# Patient Record
Sex: Female | Born: 1990
Health system: Southern US, Community
[De-identification: ages and names within clinical notes are randomized; demographics above are authoritative.]

## PROBLEM LIST (undated history)

## (undated) ENCOUNTER — Inpatient Hospital Stay (HOSPITAL_COMMUNITY): Payer: Self-pay

## (undated) DIAGNOSIS — N96 Recurrent pregnancy loss: Secondary | ICD-10-CM

## (undated) DIAGNOSIS — Z3490 Encounter for supervision of normal pregnancy, unspecified, unspecified trimester: Secondary | ICD-10-CM

## (undated) DIAGNOSIS — L309 Dermatitis, unspecified: Secondary | ICD-10-CM

## (undated) DIAGNOSIS — J3089 Other allergic rhinitis: Secondary | ICD-10-CM

## (undated) DIAGNOSIS — Z91018 Allergy to other foods: Secondary | ICD-10-CM

## (undated) HISTORY — PX: TYMPANOSTOMY TUBE PLACEMENT: SHX32

## (undated) HISTORY — DX: Dermatitis, unspecified: L30.9

---
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
## 2010-08-19 ENCOUNTER — Emergency Department (HOSPITAL_COMMUNITY): Admission: EM | Admit: 2010-08-19 | Discharge: 2010-08-19 | Payer: Self-pay | Admitting: Emergency Medicine

## 2010-12-06 LAB — DIFFERENTIAL
Basophils Relative: 0 % (ref 0–1)
Eosinophils Absolute: 0.2 10*3/uL (ref 0.0–0.7)
Eosinophils Relative: 2 % (ref 0–5)
Lymphs Abs: 2.7 10*3/uL (ref 0.7–4.0)
Monocytes Relative: 5 % (ref 3–12)
Neutrophils Relative %: 69 % (ref 43–77)

## 2010-12-06 LAB — BASIC METABOLIC PANEL
CO2: 26 mEq/L (ref 19–32)
Calcium: 9.9 mg/dL (ref 8.4–10.5)
Chloride: 106 mEq/L (ref 96–112)
Creatinine, Ser: 0.68 mg/dL (ref 0.4–1.2)
Glucose, Bld: 95 mg/dL (ref 70–99)

## 2010-12-06 LAB — CBC
MCH: 32.4 pg (ref 26.0–34.0)
MCHC: 36.4 g/dL — ABNORMAL HIGH (ref 30.0–36.0)
MCV: 89 fL (ref 78.0–100.0)
Platelets: 328 10*3/uL (ref 150–400)

## 2010-12-06 LAB — PROTIME-INR
INR: 1.01 (ref 0.00–1.49)
Prothrombin Time: 13.5 seconds (ref 11.6–15.2)

## 2010-12-20 ENCOUNTER — Emergency Department (HOSPITAL_COMMUNITY): Payer: Self-pay

## 2010-12-20 ENCOUNTER — Emergency Department (HOSPITAL_COMMUNITY)
Admission: EM | Admit: 2010-12-20 | Discharge: 2010-12-20 | Disposition: A | Discharge status: Home / Self Care | Payer: Self-pay | Attending: Emergency Medicine | Admitting: Emergency Medicine

## 2010-12-20 DIAGNOSIS — R109 Unspecified abdominal pain: Secondary | ICD-10-CM | POA: Insufficient documentation

## 2010-12-20 LAB — CBC
HCT: 41.2 % (ref 36.0–46.0)
MCH: 30.1 pg (ref 26.0–34.0)
MCHC: 34.2 g/dL (ref 30.0–36.0)
MCV: 88 fL (ref 78.0–100.0)
Platelets: 313 10*3/uL (ref 150–400)
RDW: 12.1 % (ref 11.5–15.5)

## 2010-12-20 LAB — BASIC METABOLIC PANEL
BUN: 9 mg/dL (ref 6–23)
CO2: 22 mEq/L (ref 19–32)
GFR calc non Af Amer: >60 mL/min (ref >60)
Glucose, Bld: 106 mg/dL — ABNORMAL HIGH (ref 70–99)
Potassium: 3.8 mEq/L (ref 3.5–5.1)
Sodium: 136 mEq/L (ref 135–145)

## 2010-12-20 LAB — DIFFERENTIAL
Eosinophils Absolute: 0.3 10*3/uL (ref 0.0–0.7)
Eosinophils Relative: 3 % (ref 0–5)
Lymphocytes Relative: 29 % (ref 12–46)
Lymphs Abs: 2.5 10*3/uL (ref 0.7–4.0)
Monocytes Absolute: 0.7 10*3/uL (ref 0.1–1.0)

## 2010-12-20 LAB — URINALYSIS, ROUTINE W REFLEX MICROSCOPIC
Bilirubin Urine: NEGATIVE
Hgb urine dipstick: NEGATIVE
Ketones, ur: NEGATIVE mg/dL
Nitrite: NEGATIVE
Protein, ur: NEGATIVE mg/dL
Specific Gravity, Urine: 1.03 (ref 1.005–1.030)
Urobilinogen, UA: 0.2 mg/dL (ref 0.0–1.0)

## 2011-01-04 LAB — URINALYSIS, ROUTINE W REFLEX MICROSCOPIC
Bilirubin Urine: NEGATIVE
Glucose, UA: NEGATIVE mg/dL
Nitrite: NEGATIVE
Specific Gravity, Urine: 1.02 (ref 1.005–1.030)
pH: 6.5 (ref 5.0–8.0)

## 2011-01-04 LAB — DIFFERENTIAL
Eosinophils Relative: 2 % (ref 0–5)
Lymphocytes Relative: 26 % (ref 24–48)
Lymphs Abs: 2.5 10*3/uL (ref 1.1–4.8)
Monocytes Absolute: 0.6 10*3/uL (ref 0.2–1.2)

## 2011-01-04 LAB — PREGNANCY, URINE: Preg Test, Ur: NEGATIVE

## 2011-01-04 LAB — BASIC METABOLIC PANEL
Chloride: 104 mEq/L (ref 96–112)
Glucose, Bld: 97 mg/dL (ref 70–99)
Potassium: 3.9 mEq/L (ref 3.5–5.1)
Sodium: 139 mEq/L (ref 135–145)

## 2011-01-04 LAB — CBC
HCT: 44.2 % (ref 36.0–49.0)
Hemoglobin: 15.4 g/dL (ref 12.0–16.0)
MCV: 90.9 fL (ref 78.0–98.0)
RDW: 12 % (ref 11.4–15.5)
WBC: 9.7 10*3/uL (ref 4.5–13.5)

## 2011-08-15 IMAGING — CT CT ABD-PELV W/O CM
3 of 4 series · 9 of 46 positions shown, 16 images · non-contrast
Comparison: CT abdomen and pelvis 01/12/2009.

CLINICAL DATA: Right lower quadrant abdominal pain.

CT ABDOMEN AND PELVIS WITHOUT CONTRAST 12/20/2010:
TECHNIQUE: Multidetector CT imaging of the abdomen and pelvis was
performed following the standard protocol without intravenous
contrast.

[Series 3: lung 5.0 b60f · axial · 0.72mm/px · z∈[-140,-55]mm · 5 of 27 slices shown, 10 images]
[im 5/27  soft-tissue]
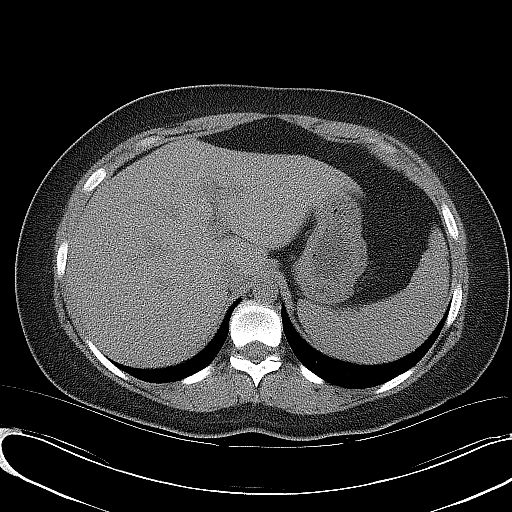
[im 5/27  bone]
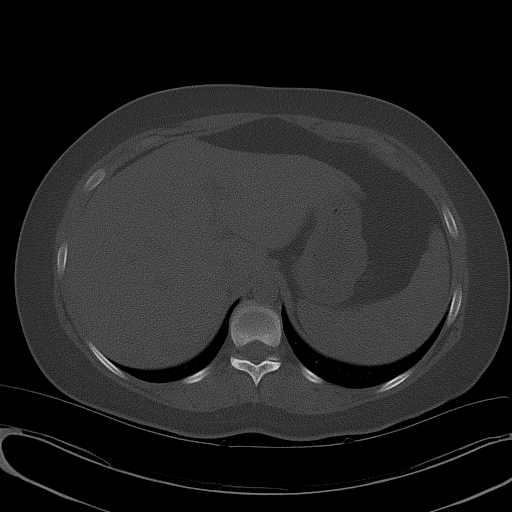
[im 9/27  soft-tissue]
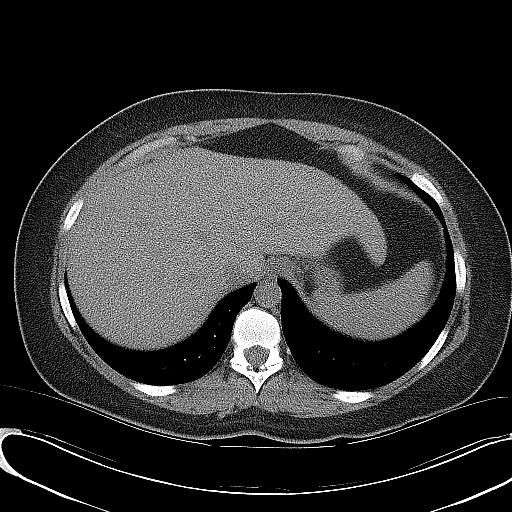
[im 9/27  lung]
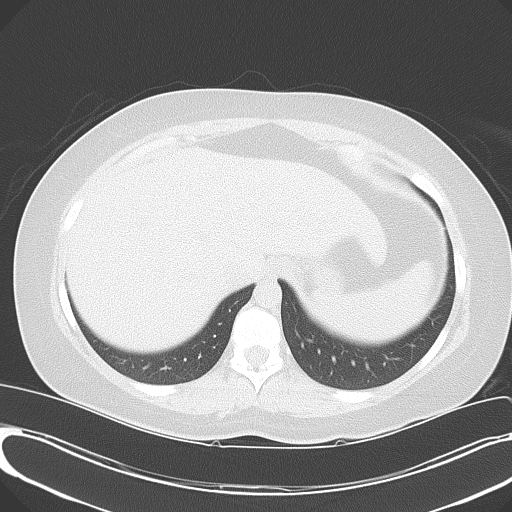
[im 14/27  soft-tissue]
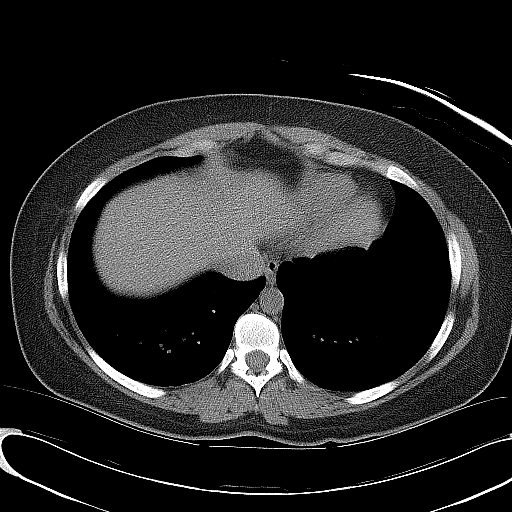
[im 14/27  lung]
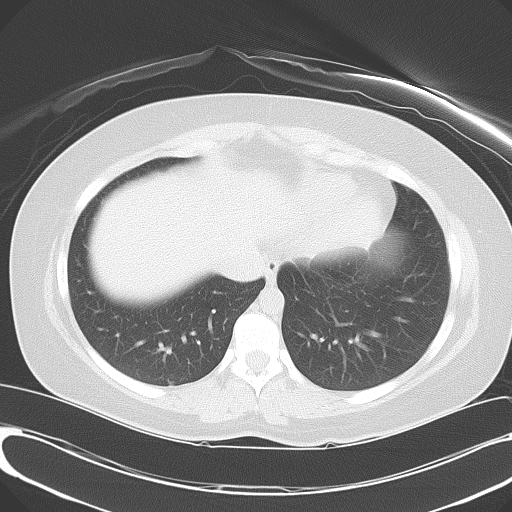
[im 18/27  soft-tissue]
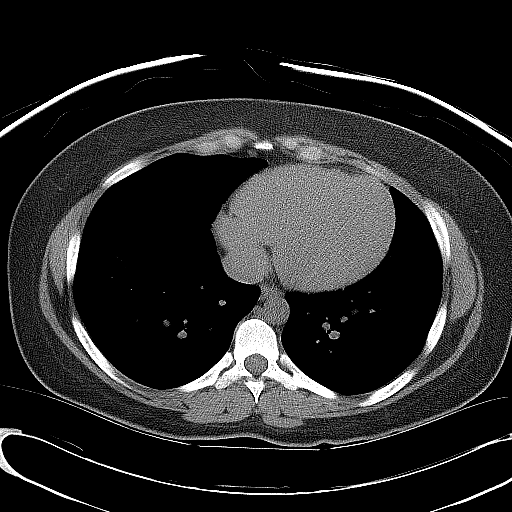
[im 18/27  lung]
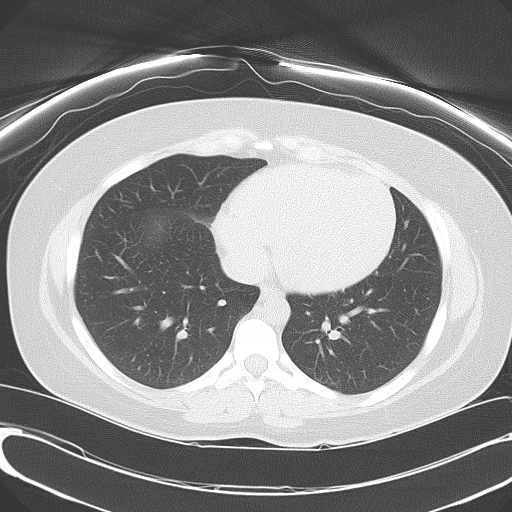
[im 22/27  soft-tissue]
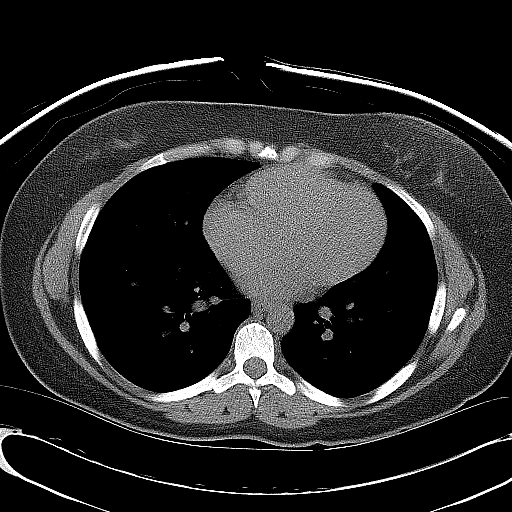
[im 22/27  lung]
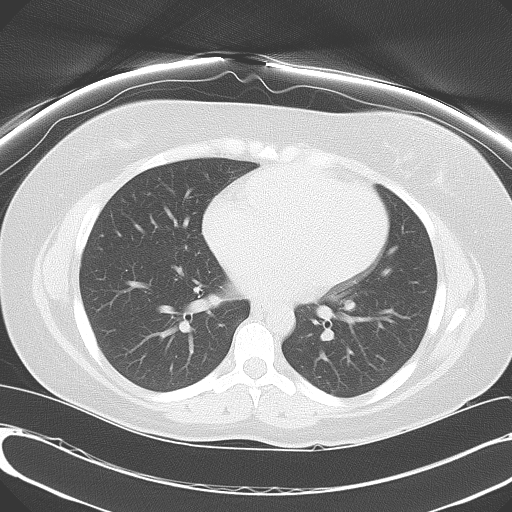

[Series 4: mpr coronal (id) · coronal · 0.69mm/px · 3 of 91 slices shown, 4 images]
[im 31/91  soft-tissue]
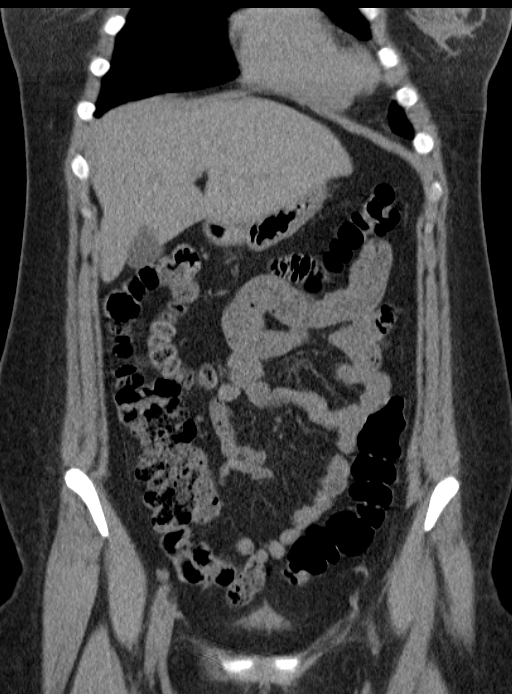
[im 41/91  soft-tissue]
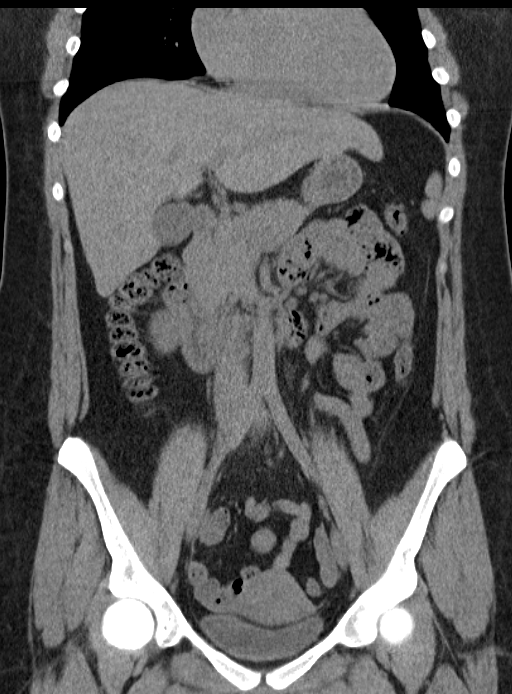
[im 41/91  bone]
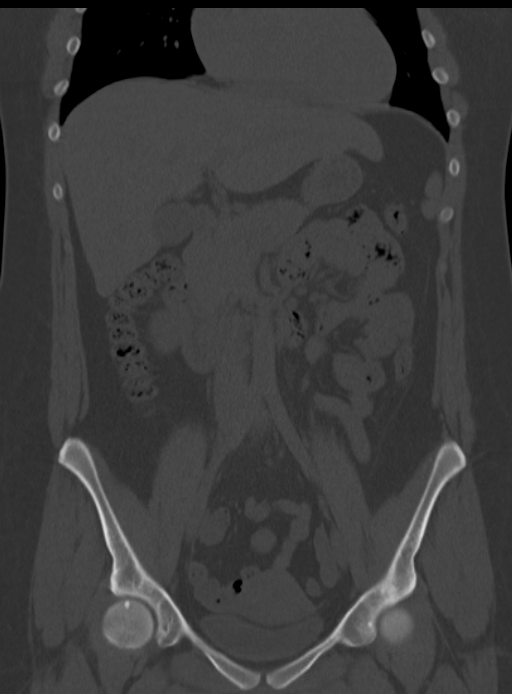
[im 51/91  soft-tissue]
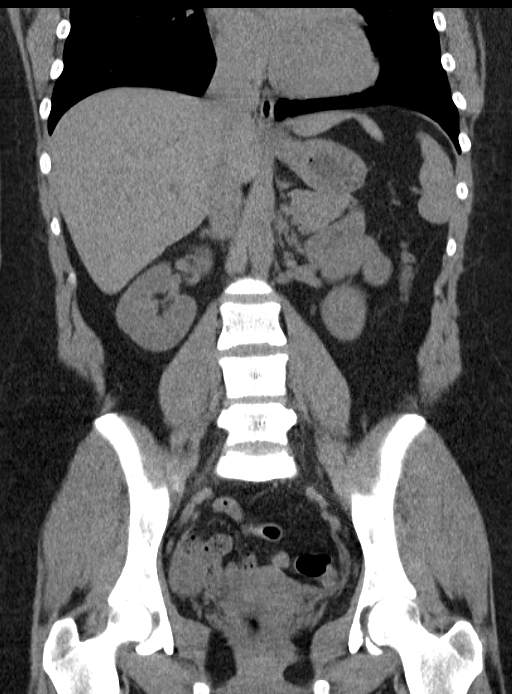

[Series 5: mpr sagittal (id) · sagittal · 0.63mm/px · 1 of 121 slices shown, 2 images]
[im 41/121  soft-tissue]
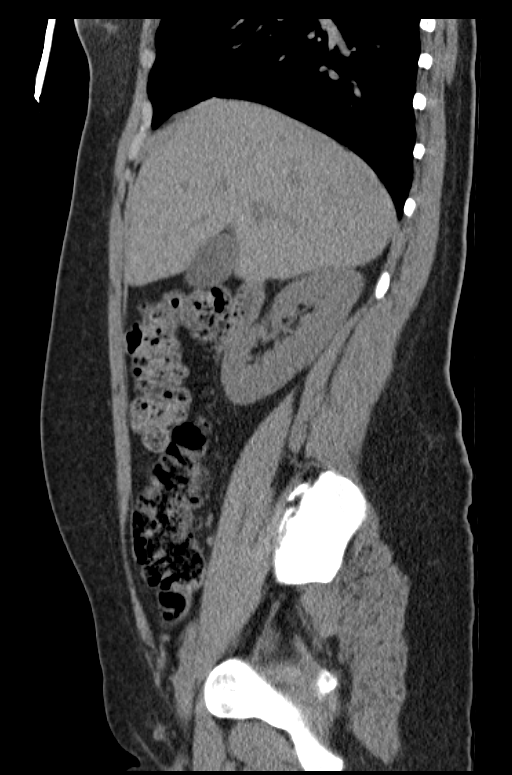
[im 41/121  bone]
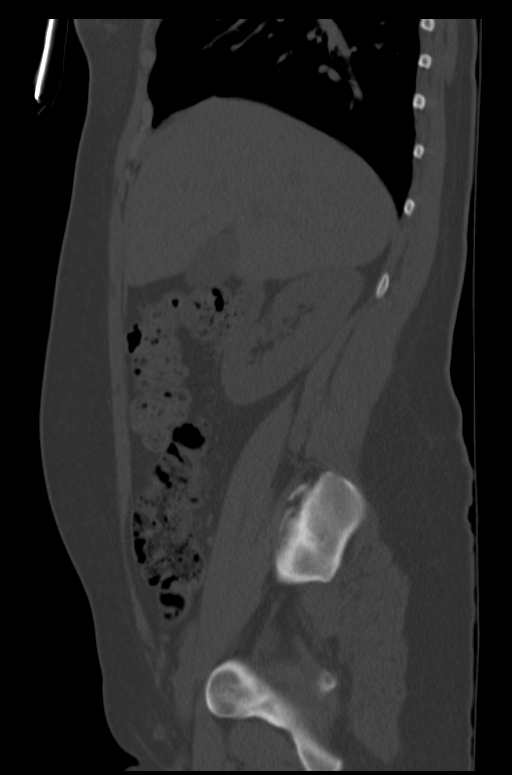

[9 of 46 positions shown; findings below may reference images not displayed]

FINDINGS: Normal appendix in the right upper pelvis.  Stomach
decompressed and unremarkable by CT.  Small bowel and colon normal
in appearance.  No ascites.

Normal low dose unenhanced appearance of the liver, spleen,
pancreas, adrenal glands, and kidneys.  Gallbladder unremarkable by
CT.  No biliary ductal dilation.  Scattered normal-sized lymph
nodes in the mesentery and retroperitoneum; no significant
lymphadenopathy.  No visible aorto-iliofemoral atherosclerosis.

Uterus normal by CT.  Normal appearing ovaries.  No free pelvic
fluid.  Visualized lung bases clear.  Heart size normal.  Bone
window images unremarkable; bone island noted in the L3 vertebral
body.
IMPRESSION: Normal unenhanced CT of the abdomen and pelvis.

## 2012-12-24 HISTORY — PX: LAPAROSCOPIC CHOLECYSTECTOMY: SUR755

## 2013-05-09 ENCOUNTER — Emergency Department (HOSPITAL_COMMUNITY)
Admission: EM | Admit: 2013-05-09 | Discharge: 2013-05-09 | Disposition: A | Discharge status: Home / Self Care | Payer: Worker's Compensation | Attending: Emergency Medicine | Admitting: Emergency Medicine

## 2013-05-09 ENCOUNTER — Encounter (HOSPITAL_COMMUNITY): Payer: Self-pay

## 2013-05-09 DIAGNOSIS — Y9389 Activity, other specified: Secondary | ICD-10-CM | POA: Insufficient documentation

## 2013-05-09 DIAGNOSIS — W460XXA Contact with hypodermic needle, initial encounter: Secondary | ICD-10-CM | POA: Insufficient documentation

## 2013-05-09 DIAGNOSIS — X58XXXA Exposure to other specified factors, initial encounter: Secondary | ICD-10-CM

## 2013-05-09 DIAGNOSIS — S61209A Unspecified open wound of unspecified finger without damage to nail, initial encounter: Secondary | ICD-10-CM | POA: Insufficient documentation

## 2013-05-09 DIAGNOSIS — Y9210 Unspecified residential institution as the place of occurrence of the external cause: Secondary | ICD-10-CM | POA: Insufficient documentation

## 2013-05-09 NOTE — ED Provider Notes (Signed)
CSN: [id number]     Arrival date & time 05/09/13  1419 History     First MD Initiated Contact with Patient 05/09/13 1506     Chief Complaint  Patient presents with  . Body Fluid Exposure   (Consider location/radiation/quality/duration/timing/severity/associated sxs/prior Treatment) HPI Comments: LAKETIA VICKNAIR is a 22 y.o. female who presents to the Emergency Department complaining of recent blood exposure secondary to a needle stick that occurred at her place of employment.  Incident occurred several hours PTA. Reports needle stick to the right index finger. Patient states the incident was reported to her supervisor and the wound site was washed immediately after exposure.  patient is employee of [employer].  She states the source individual consented to blood draw and his results are pending.  Patient denies pain or sx's at this time.    The history is provided by the patient.    History reviewed. No pertinent past medical history. Past Surgical History  Procedure Laterality Date  . Cholecystectomy     No family history on file. History  Substance Use Topics  . Smoking status: Never Smoker   . Smokeless tobacco: Not on file  . Alcohol Use: Yes   OB History   Grav Para Term Preterm Abortions TAB SAB Ect Mult Living                 Review of Systems  Constitutional: Negative for fever and chills.  Genitourinary: Negative for dysuria and difficulty urinating.  Musculoskeletal: Negative for joint swelling and arthralgias.  Skin: Positive for wound. Negative for color change.       Needle stick to right index finger  Neurological: Negative for dizziness, weakness and numbness.  Hematological: Negative for adenopathy. Does not bruise/bleed easily.  All other systems reviewed and are negative.    Allergies  Review of patient's allergies indicates no known allergies.  Home Medications  No current outpatient prescriptions on file. BP 119/63  Pulse 65   Temp(Src) 98 F (36.7 C) (Oral)  Resp 20  Ht 5\' 6"  (1.676 m)  Wt 180 lb (81.647 kg)  BMI 29.07 kg/m2  SpO2 98%  LMP 04/25/2013 Physical Exam  Nursing note and vitals reviewed. Constitutional: She is oriented to person, place, and time. She appears well-developed and well-nourished. No distress.  HENT:  Head: Normocephalic and atraumatic.  Cardiovascular: Normal rate, regular rhythm, normal heart sounds and intact distal pulses.   No murmur heard. Pulmonary/Chest: Effort normal and breath sounds normal.  Musculoskeletal: Normal range of motion. She exhibits no edema.  Pin point puncture wound to the distal right index finger.  No erythema or active bleeding.  Pt has full ROM of the finger.  Radial pulse brisk, distal sensation intact  Neurological: She is alert and oriented to person, place, and time. She exhibits normal muscle tone. Coordination normal.  Skin: Skin is warm and dry.    ED Course   Procedures (including critical care time)  Labs Reviewed  RAPID HIV SCREEN ([facility])  HEPATITIS B SURFACE ANTIGEN  HEPATITIS B SURFACE ANTIBODY  HEPATITIS C ANTIBODY   No results found. 1. Needle exposure, initial encounter     MDM   Consulted Stephanie with occupational health and Claris Che in the lab to confirm correct lab orders.   Pt requesting discharge, VSS,  Stable at this time.  I advised that I will contact her with her HIV results.    Rapid HIV is non-reactive, remaining labs are pending.  Patient informed of  rapid HIV results via telephone after discharge.  She agrees to close f/u with her PMD and to return here if needed  Deion Swift L. Trisha Mangle, PA-C 05/10/13 2128

## 2013-05-09 NOTE — ED Notes (Signed)
Pt reports being stuck by a needle at work today.  Pt is employed by [employer].

## 2013-05-09 NOTE — ED Notes (Signed)
Iva Lento, PA , spoke with Judeth Cornfield RN at [facility] to confirm correct lab work being done.

## 2013-05-09 NOTE — ED Notes (Signed)
Needle stick to rt index finger distal phalanx, tiny puncture. No bleeding  Pt washed area  pta.

## 2013-05-10 LAB — HEPATITIS C ANTIBODY: HCV Ab: NEGATIVE

## 2013-05-11 NOTE — ED Provider Notes (Signed)
Medical screening examination/treatment/procedure(s) were performed by non-physician practitioner and as supervising physician I was immediately available for consultation/collaboration.  Arnice Vanepps, MD 05/11/13 1517 

## 2015-05-19 ENCOUNTER — Encounter (HOSPITAL_COMMUNITY): Payer: Self-pay | Admitting: Emergency Medicine

## 2015-05-19 ENCOUNTER — Emergency Department (HOSPITAL_COMMUNITY)
Admission: EM | Admit: 2015-05-19 | Discharge: 2015-05-19 | Disposition: A | Discharge status: Home / Self Care | Payer: Self-pay | Attending: Emergency Medicine | Admitting: Emergency Medicine

## 2015-05-19 DIAGNOSIS — L509 Urticaria, unspecified: Secondary | ICD-10-CM

## 2015-05-19 DIAGNOSIS — L500 Allergic urticaria: Secondary | ICD-10-CM | POA: Insufficient documentation

## 2015-05-19 DIAGNOSIS — R062 Wheezing: Secondary | ICD-10-CM | POA: Insufficient documentation

## 2015-05-19 DIAGNOSIS — R0789 Other chest pain: Secondary | ICD-10-CM | POA: Insufficient documentation

## 2015-05-19 DIAGNOSIS — F419 Anxiety disorder, unspecified: Secondary | ICD-10-CM | POA: Insufficient documentation

## 2015-05-19 MED ORDER — PREDNISONE 50 MG PO TABS
60.0000 mg | ORAL_TABLET | Freq: Once | ORAL | Status: AC
  Administered 2015-05-19: 60 mg via ORAL
  Filled 2015-05-19 (×2): qty 1

## 2015-05-19 MED ORDER — PREDNISONE 10 MG PO TABS: ORAL_TABLET | ORAL | Status: DC

## 2015-05-19 MED ORDER — HYDROXYZINE HCL 50 MG/ML IM SOLN
50.0000 mg | Freq: Once | INTRAMUSCULAR | Status: AC
  Administered 2015-05-19: 50 mg via INTRAMUSCULAR
  Filled 2015-05-19: qty 1

## 2015-05-19 MED ORDER — HYDROXYZINE HCL 25 MG PO TABS: 25.0000 mg | ORAL_TABLET | Freq: Four times a day (QID) | ORAL | Status: DC

## 2015-05-19 NOTE — ED Notes (Signed)
Pt states she woke up itching with rash. Pt also c/o wheezing.

## 2015-05-19 NOTE — ED Notes (Signed)
   05/19/15 0050  Skin Color/Condition  Skin Color/Condition (WDL) X  Skin Color Appropriate for ethnicity  Skin Integrity Intact  Skin Condition Dry  pt presents w/ rash & itching all over. Pt denies any new foods, meds, clothes or detergents.

## 2015-05-19 NOTE — ED Notes (Signed)
Pt alert & oriented x4, stable gait. Patient given discharge instructions, paperwork & prescription(s). Patient  instructed to stop at the registration desk to finish any additional paperwork. Patient verbalized understanding. Pt left department w/ no further questions. 

## 2015-05-19 NOTE — ED Notes (Signed)
Pt feeling better at present, itching has lessened & rash has lightened up also. Pt resting w/ family at bedside. NAD noted. No needs voiced.

## 2015-05-19 NOTE — ED Provider Notes (Signed)
CSN: [id number]     Arrival date & time 05/19/15  0026 History   First MD Initiated Contact with Patient 05/19/15 [phone]     Chief Complaint  Patient presents with  . Allergic Reaction     (Consider location/radiation/quality/duration/timing/severity/associated sxs/prior Treatment) HPI Comments: Patient is a 24 year old female who presents to the emergency department with "allergic reaction".  The patient states that she was awakened tonight by itching, hives, and wheezing. She has a sensation that she was having difficulty with her breathing. She was able to converse with her mother on the phone, and tell her that she was coming to her mother's home and wanted to be taken to the emergency department. The patient was able to drive herself to her mother's home before being brought to the emergency department. The patient states that she does not recall anything new to eat or drink today. She has not had any new clothing, detergent, or dryer sheets, or new environment. Patient states this is the fifth episode this year. She has been evaluated by her primary physician, and advised to see an allergist, but states she does not have insurance, and is reluctant to see an allergist on a cash basis. The patient states she took some Benadryl at home, and she feels slightly better, but still has some itching.  Patient is a 24 y.o. female presenting with allergic reaction. The history is provided by the patient.  Allergic Reaction Presenting symptoms: difficulty breathing, itching, rash and wheezing   Presenting symptoms: no difficulty swallowing     History reviewed. No pertinent past medical history. Past Surgical History  Procedure Laterality Date  . Cholecystectomy     History reviewed. No pertinent family history. Social History  Substance Use Topics  . Smoking status: Never Smoker   . Smokeless tobacco: None  . Alcohol Use: Yes   OB History    No data available     Review of Systems   HENT: Negative for trouble swallowing.   Respiratory: Positive for chest tightness and wheezing. Negative for cough.   Skin: Positive for itching and rash.  Psychiatric/Behavioral: The patient is nervous/anxious.   All other systems reviewed and are negative.     Allergies  Review of patient's allergies indicates no known allergies.  Home Medications   Prior to Admission medications   Not on File   BP 100/49 mmHg  Pulse 69  Temp(Src) 98.2 F (36.8 C)  Resp 22  Ht 5\' 6"  (1.676 m)  Wt 175 lb (79.379 kg)  BMI 28.26 kg/m2  SpO2 99%  LMP 05/17/2015 Physical Exam  Constitutional: She is oriented to person, place, and time. She appears well-developed and well-nourished.  Non-toxic appearance.  HENT:  Head: Normocephalic.  Right Ear: Tympanic membrane and external ear normal.  Left Ear: Tympanic membrane and external ear normal.  The airway is patent. The uvula is in the midline. Patient speaks in complete sentences.  Eyes: EOM and lids are normal. Pupils are equal, round, and reactive to light.  Neck: Normal range of motion. Neck supple. Carotid bruit is not present.  Cardiovascular: Normal rate, regular rhythm, normal heart sounds, intact distal pulses and normal pulses.   Pulmonary/Chest: Breath sounds normal. No respiratory distress.  Symmetrical rise and fall of the chest. Patient speaks in complete sentences. No wheezes noted at this time.  Abdominal: Soft. Bowel sounds are normal. There is no tenderness. There is no guarding.  Musculoskeletal: Normal range of motion.  Lymphadenopathy:  Head (right side): No submandibular adenopathy present.       Head (left side): No submandibular adenopathy present.    She has no cervical adenopathy.  Neurological: She is alert and oriented to person, place, and time. She has normal strength. No cranial nerve deficit or sensory deficit.  Skin: Skin is warm and dry.  Hives noted on the chest, back, legs, chest, and a few on the  abdomen.  Psychiatric: She has a normal mood and affect. Her speech is normal.  Nursing note and vitals reviewed.   ED Course  Procedures (including critical care time) Labs Review Labs Reviewed - No data to display  Imaging Review No results found. I have personally reviewed and evaluated these images and lab results as part of my medical decision-making.   EKG Interpretation None      MDM  Vital signs within normal limits. Pulse oximetry is 99% on room air. Itching much improved after intramuscular Vistaril. Few hives remaining, but most have cleared. Patient speaks in complete sentences without problem. Patient will use Claritin each morning, and Vistaril at bedtime if needed for itching or hives. I strongly encouraged the patient to see the allergist as suggested by her primary physician for additional evaluation. The patient is in agreement with this discharge plan.    Final diagnoses:  None    *I have reviewed nursing notes, vital signs, and all appropriate lab and imaging results for this patient.[address] PA-C 05/22/15 1723  Zadie Rhine, MD 05/23/15 [phone]

## 2015-05-19 NOTE — Discharge Instructions (Signed)
Please see Dr. Willa Rough, or the allergist of your choice for additional evaluation and management. Please return to the emergency department if any emergent changes, problems, or concerns. Hives Hives are itchy, red, puffy (swollen) areas of the skin. Hives can change in size and location on your body. Hives can come and go for hours, days, or weeks. Hives do not spread from person to person (noncontagious). Scratching, exercise, and stress can make your hives worse. HOME CARE  Avoid things that cause your hives (triggers).  Take antihistamine medicines as told by your doctor. Do not drive while taking an antihistamine.  Take any other medicines for itching as told by your doctor.  Wear loose-fitting clothing.  Keep all doctor visits as told. GET HELP RIGHT AWAY IF:   You have a fever.  Your tongue or lips are puffy.  You have trouble breathing or swallowing.  You feel tightness in the throat or chest.  You have belly (abdominal) pain.  You have lasting or severe itching that is not helped by medicine.  You have painful or puffy joints. These problems may be the first sign of a life-threatening allergic reaction. Call your local emergency services (911 in [place] MAKE SURE YOU:   Understand these instructions.  Will watch your condition.  Will get help right away if you are not doing well or get worse. Document Released: 06/20/2008 Document Revised: 03/12/2012 Document Reviewed: 12/05/2011 [facility] Patient Information 2015 [employer], [place]. This information is not intended to replace advice given to you by your health care provider. Make sure you discuss any questions you have with your health care provider.

## 2015-06-29 ENCOUNTER — Ambulatory Visit (INDEPENDENT_AMBULATORY_CARE_PROVIDER_SITE_OTHER): Payer: BLUE CROSS/BLUE SHIELD | Admitting: Allergy and Immunology

## 2015-06-29 ENCOUNTER — Encounter: Payer: Self-pay | Admitting: Allergy and Immunology

## 2015-06-29 VITALS — BP 108/62 | HR 56 | Temp 97.9°F | Resp 18 | Ht 65.55 in | Wt 168.7 lb

## 2015-06-29 DIAGNOSIS — J302 Other seasonal allergic rhinitis: Principal | ICD-10-CM

## 2015-06-29 DIAGNOSIS — J309 Allergic rhinitis, unspecified: Secondary | ICD-10-CM

## 2015-06-29 DIAGNOSIS — H1010 Acute atopic conjunctivitis, unspecified eye: Secondary | ICD-10-CM | POA: Diagnosis not present

## 2015-06-29 DIAGNOSIS — R05 Cough: Secondary | ICD-10-CM

## 2015-06-29 DIAGNOSIS — L509 Urticaria, unspecified: Secondary | ICD-10-CM | POA: Diagnosis not present

## 2015-06-29 DIAGNOSIS — T7840XD Allergy, unspecified, subsequent encounter: Secondary | ICD-10-CM | POA: Diagnosis not present

## 2015-06-29 DIAGNOSIS — R059 Cough, unspecified: Secondary | ICD-10-CM

## 2015-06-29 DIAGNOSIS — J3089 Other allergic rhinitis: Principal | ICD-10-CM

## 2015-06-29 MED ORDER — EPINEPHRINE 0.3 MG/0.3ML IJ SOAJ: 0.3000 mg | Freq: Once | INTRAMUSCULAR | Status: DC

## 2015-06-29 NOTE — Patient Instructions (Signed)
Take Home Sheet  1. Avoidance: Mite, Mold and Pollen, Cat/Dog and Cockroach.   2. Antihistamine: Zyrtec  by mouth once daily for runny nose or itching.   3. Nasal Spray: Rhinocort AQ 1-2 spray(s) each nostril once daily for stuffy nose or drainage.    4. Inhalers:  Rescue: ProAir respiclick 2 puffs every 4 hours as needed for cough or wheeze.       Call with any recurring ProAir use.  5. Other:  Obtain selected labs at [facility].   6.  Epi-pen/Benadryl as needed.  7. Nasal Saline wash followed by nasal spray once daily as directed.   8. Follow up Visit: 2 months or sooner if needed.   Websites that have reliable Patient information: 1. American Academy of Asthma, Allergy, & Immunology: www.aaaai.org 2. Food Allergy Network: www.foodallergy.org 3. Mothers of Asthmatics: www.aanma.org 4. [facility]: https://www.strong.com/ 5. American College of Allergy, Asthma, & Immunology: BiggerRewards.is or www.acaai.org

## 2015-06-30 ENCOUNTER — Other Ambulatory Visit: Payer: Self-pay

## 2015-06-30 ENCOUNTER — Telehealth: Payer: Self-pay

## 2015-06-30 MED ORDER — ALBUTEROL SULFATE 108 (90 BASE) MCG/ACT IN AEPB: 2.0000 | INHALATION_SPRAY | RESPIRATORY_TRACT | Status: DC | PRN

## 2015-06-30 MED ORDER — EPINEPHRINE 0.3 MG/0.3ML IJ SOAJ: 0.3000 mg | Freq: Once | INTRAMUSCULAR | Status: DC

## 2015-06-30 NOTE — Telephone Encounter (Signed)
EpiPen was sent to [facility] on 06/29/15.  ProAir Respiclick was sent to [facility] today.  According to our notes [facility] was her pharmacy.  Left message on patients voicemail to call the office.

## 2015-06-30 NOTE — Telephone Encounter (Signed)
Patient returned call.  She does not use [facility] in [place] and would like rx's transferred to [facility].  Advised patient I would call [facility] and transfer these and to allow a few hours before picking them up.  Patient voices understanding.

## 2015-06-30 NOTE — Telephone Encounter (Signed)
Pt called to let us know she never received any of her prescriptions to the [place] [facility]. Patient was seen on Yesterday 06/29/2015.  Please Advise.

## 2015-07-02 LAB — CBC WITH DIFFERENTIAL/PLATELET
BASOS PCT: 0 % (ref 0–1)
Basophils Absolute: 0 10*3/uL (ref 0.0–0.1)
EOS ABS: 0.2 10*3/uL (ref 0.0–0.7)
Eosinophils Relative: 4 % (ref 0–5)
HCT: 39.6 % (ref 36.0–46.0)
Hemoglobin: 13.4 g/dL (ref 12.0–15.0)
Lymphocytes Relative: 38 % (ref 12–46)
Lymphs Abs: 2 10*3/uL (ref 0.7–4.0)
MCH: 30.8 pg (ref 26.0–34.0)
MCHC: 33.8 g/dL (ref 30.0–36.0)
MCV: 91 fL (ref 78.0–100.0)
MONO ABS: 0.4 10*3/uL (ref 0.1–1.0)
MONOS PCT: 7 % (ref 3–12)
MPV: 9.6 fL (ref 8.6–12.4)
Neutro Abs: 2.7 10*3/uL (ref 1.7–7.7)
Neutrophils Relative %: 51 % (ref 43–77)
PLATELETS: 243 10*3/uL (ref 150–400)
RBC: 4.35 MIL/uL (ref 3.87–5.11)
RDW: 12.2 % (ref 11.5–15.5)
WBC: 5.2 10*3/uL (ref 4.0–10.5)

## 2015-07-03 LAB — TSH: TSH: 1.161 u[IU]/mL (ref 0.350–4.500)

## 2015-07-04 ENCOUNTER — Encounter: Payer: Self-pay | Admitting: Allergy and Immunology

## 2015-07-04 NOTE — Progress Notes (Signed)
NEW PATIENT NOTE  RE: Latoya Reeves MRN: [id number] DOB: 01/05/1991 [facility] [place] [address] [place] [address] Date of Office Visit: 06/29/2015  Referring provider: Selinda Flavin, MD [address] [place], [place] [address]  Subjective:  Latoya Reeves is a 24 y.o. female who presents today with concern for allergies.  HPI: Latoya Reeves reports a recent history of allergy symptoms in the last 18 months.  Typically recalls rhinorrhea, congestion, sneezing, itchy watery eyes, generalized itching occasionally associated with hives.  These symptoms are most prominent when outdoors especially with mowing the lawn, with pollen and dust exposures.  However, now wonders about 3 episodes this year and approximately 6 or more total in the evening hours 10 pm-12 midnight, on occasion awakening from sleep with change in breathing, generalized hives which has progressed to difficulty in breathing, shortness of breath, chest tightness, cough and wheeze and sensation of lip/throat tightening with nausea, abdominal pressure and loose stools.  She has received Prednisone for management through the ED.  She wonders if any relation to [employer] or [employer] (cheeseburger, chicken tenders, fries, onion rings) or chocolate covered peanuts.  There are no restrictions to her diet which includes tuna, salmon,steak, deer, pork and various veggies.She reports no known or consistent triggers to her episodes.   She has recalled tick bites.   Medical History: Past Medical History  Diagnosis Date  . Eczema   . Urticaria    Surgical History: Past Surgical History  Procedure Laterality Date  . Cholecystectomy    . Tympanostomy tube placement     Family History: Family History  Problem Relation Age of Onset  . Eczema Father   . Allergic rhinitis Sister   . 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   Social History   Social History  . Marital Status: Single    Spouse Name: N/A  . Number of Children: N/A  . Years of Education: N/A   Occupational History  . Not on file.   Social History Main Topics  . Smoking status: Current Some Day Smoker  . Smokeless tobacco: Never Used  . Alcohol Use: 0.0 oz/week    0 Standard drinks or equivalent per week  . Drug Use: No  . Sexual Activity: Not on file   Other Topics Concern  . Not on file   Social History Narrative    Medications prior to this encounter: Outpatient Prescriptions Prior to Visit  Medication Sig Dispense Refill  . hydrOXYzine (ATARAX/VISTARIL) 25 MG tablet Take 1 tablet (25 mg total) by mouth every 6 (six) hours. (Patient taking differently: Take 25 mg by mouth every 6 (six) hours as needed. ) 15 tablet 0  . predniSONE (DELTASONE) 10 MG tablet 5,4,3,2,1 - take with food (Patient not taking: Reported on 06/29/2015) 15 tablet 0   No facility-administered medications prior to visit.    Drug Allergies: No Known Allergies  Environmental History: Latoya Reeves lives in 24 year old house 2 months with laminate floors, central air and heat, dogs throughout the home, stuffed mattress, cotton pillows/comforter without humidifier or smokers.   Review of Systems  Constitutional: Negative for fever, chills, weight loss and malaise/fatigue.  HENT: Positive for congestion. Negative for ear pain, hearing loss and sore  throat.   Eyes: Negative for blurred vision, pain, discharge and redness.  Respiratory: Positive for cough and wheezing. Negative for hemoptysis, sputum production and shortness of breath.        Negative PPD; (with acute reaction episodes)  Cardiovascular: Negative for chest pain and palpitations.  Gastrointestinal: Positive for nausea and diarrhea. Negative for heartburn, vomiting, constipation and blood in stool.       (symptoms  acutely with reaction episodes)  Musculoskeletal: Negative.   Skin: Positive for rash.       (hives with acute reaction episodes)  Neurological: Negative for dizziness, tingling, focal weakness, seizures, weakness and headaches.  Endo/Heme/Allergies: Positive for environmental allergies. Does not bruise/bleed easily.    Objective:   Filed Vitals:   06/29/15 1402  BP: 108/62  Pulse: 56  Temp: 97.9 F (36.6 C)  Resp: 18   Physical Exam  Constitutional: She is well-developed, well-nourished, and in no distress.  HENT:  Head: Atraumatic.  Right Ear: Tympanic membrane and ear canal normal.  Left Ear: Tympanic membrane and ear canal normal. No tenderness.  Nose: Mucosal edema present. No rhinorrhea or sinus tenderness. No epistaxis.  Mouth/Throat: Uvula is midline, oropharynx is clear and moist and mucous membranes are normal.  Neck: Neck supple.  Cardiovascular: Normal rate, S1 normal and S2 normal.   No murmur heard. Pulmonary/Chest: Effort normal. She has no wheezes. She has no rhonchi. She has no rales.  Abdominal: Soft. Normal appearance and bowel sounds are normal.  Lymphadenopathy:    She has no cervical adenopathy.  Neurological: She is alert.  Skin: Skin is warm and dry. Rash is not macular, not maculopapular and not urticarial. No cyanosis or erythema. Nails show no clubbing.  No dermatographism     Diagnostics: Spirometry:  FVC 3.41--88%, FEV1 --97%.  Skin testing:  Strong reactivity to dust mite, cat, dog and multiple mold species; mild reactivity to grass and weed pollens and cockroach with negative selected food testing. Assessment:   1. Allergic rhinoconjunctivitis, seasonal and perennial   2. Cough   3. Hives   4. Allergic reaction, subsequent encounter    Plan:   .   Patient Instructions  Take Home Sheet  1. Avoidance: Mite, Mold and Pollen, Cat/Dog and Cockroach.   2. Antihistamine: Zyrtec  by mouth once daily for runny nose or itching.   3.  Nasal Spray: Rhinocort AQ 1-2 spray(s) each nostril once daily for stuffy nose or drainage.    4. Inhalers:  Rescue: ProAir respiclick 2 puffs every 4 hours as needed for cough or wheeze.       Call with any recurring ProAir use.  5. Other:  Obtain selected labs at [facility].   6.  Epi-pen/Benadryl as needed.  7. Nasal Saline wash followed by nasal spray once daily as directed.   8. Follow up Visit: 2 months or sooner if needed.   Websites that have reliable Patient information: 1. American Academy of Asthma, Allergy, & Immunology: www.aaaai.org 2. Food Allergy Network: www.foodallergy.org 3. Mothers of Asthmatics: www.aanma.org 4. [facility]: https://www.strong.com/ 5. American College of Allergy, Asthma, & Immunology: BiggerRewards.is or www.acaai.org      Roselyn M. Willa Rough, MD

## 2015-07-05 LAB — IGE: IGE (IMMUNOGLOBULIN E), SERUM: 126 kU/L — AB (ref <115)

## 2015-07-06 LAB — TRYPTASE: TRYPTASE: 4.8 ug/L (ref <11)

## 2015-07-07 LAB — ALPHA-GAL PANEL
ALLERGEN, MUTTON, F88: 3.16 kU/L — AB
Allergen, Pork, f26: 2.85 kU/L — ABNORMAL HIGH
BEEF: 4.5 kU/L — AB
GALACTOSE-ALPHA-1, 3-GALACTOSE IGE: 5.97 kU/L — AB (ref <0.35)

## 2015-07-12 NOTE — Progress Notes (Signed)
Left message to return call regarding results.  

## 2015-07-14 ENCOUNTER — Telehealth: Payer: Self-pay | Admitting: Allergy and Immunology

## 2015-07-14 NOTE — Progress Notes (Signed)
Return call from patient regarding test results. Attempted to callbck x 2 no voice mail set up

## 2015-07-14 NOTE — Telephone Encounter (Signed)
Called patient x 2 no voice mail set up

## 2015-07-14 NOTE — Telephone Encounter (Signed)
Patient calls and she is advised per Dr. 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  She is advised to 100% avoid Beef, Pork, Lamb and Continental AirlinesDeer.  Patient is also advised to make sure she has an Epipen and Benadryl at all times.  Patient voiced understood.

## 2015-07-14 NOTE — Telephone Encounter (Signed)
Pt says she is returning someone's call about her lab results pls advise

## 2015-09-07 ENCOUNTER — Encounter: Payer: Self-pay | Admitting: Allergy and Immunology

## 2015-09-07 ENCOUNTER — Ambulatory Visit (INDEPENDENT_AMBULATORY_CARE_PROVIDER_SITE_OTHER): Payer: BLUE CROSS/BLUE SHIELD | Admitting: Allergy and Immunology

## 2015-09-07 VITALS — BP 122/68 | HR 58 | Temp 98.6°F | Resp 18

## 2015-09-07 DIAGNOSIS — J309 Allergic rhinitis, unspecified: Secondary | ICD-10-CM | POA: Diagnosis not present

## 2015-09-07 DIAGNOSIS — R05 Cough: Secondary | ICD-10-CM | POA: Diagnosis not present

## 2015-09-07 DIAGNOSIS — Z91014 Allergy to mammalian meats: Secondary | ICD-10-CM

## 2015-09-07 DIAGNOSIS — R059 Cough, unspecified: Secondary | ICD-10-CM

## 2015-09-07 DIAGNOSIS — Z91018 Allergy to other foods: Secondary | ICD-10-CM | POA: Diagnosis not present

## 2015-09-07 DIAGNOSIS — J3089 Other allergic rhinitis: Secondary | ICD-10-CM

## 2015-09-07 NOTE — Progress Notes (Addendum)
FOLLOW UP NOTE  RE: Latoya Reeves MRN: [id number] DOB: 1991/05/08 [facility] [place] [address] [place] [id number] Date of Office Visit: 09/07/2015  Subjective:  Latoya Reeves is a 24 y.o. female who presents today for Medication Management  Assessment:   1. Alpha gal mammalian allergy.  2. Cough, asymptomatic.  3. Perennial allergic rhinitis.    Plan:    Patient Instructions   1. 100% avoidance of all beef, pork, lamb and deer products. 2.  Emergency action plan--EpiPen and Benadryl. 3.  Continue Zyrtec 10 mg once daily. 4.  Saline nasal wash once daily and as needed. 5.  ProAir and Rhinocort AQ as needed. 6.  Follow-up in 7 months or sooner if needed-- with plans for recheck labs in late 2017. 7.  Information given for alpha gal expert:  Latoya Reeves----[employer]   HPI: Latoya Reeves returns to the office regarding allergic rhinitis, food allergy and previous history of hives since her initial visit in October.  She reports consistently using Zyrtec which has been beneficial and occasionally uses the Rhinocort but has not needed any ProAir.  There is rare nasal congestion or sneezing but no headache, sorethroat or recurring, post nasal drip.  She denies cough, wheeze, shortness of breath or any chest symptoms.  She admits to an occasional ingestion of cheeseburger, which prompted GI upset with cramping but no hives or other symptoms despite our communication for 100% avoidance of beef, pork, Lamb and  deer after positive laboratory results were reviewed. Denies ED or urgent care visits, prednisone or antibiotic courses. Reports sleep and activity are normal.  There have been no other concerns or questions.  Current Medications: 1.  EpiPen, Benadryl as needed. 2.  Zyrtec 10 mg once daily. 3.  Rhinocort AQ one to 2 sprays once daily. 4.  ProAir HFA 2 puffs as needed. 5.  Sprintec daily.  Drug Allergies: Allergies   Allergen Reactions  . Other Anaphylaxis    ALPHA-GAL (BEEF, PORK, LAMB, DEER, MUTTON).  REACTION ANAPHYLAXIS, HIVES, GI ISSUES.    Objective:   Filed Vitals:   09/07/15 1532  BP: 122/68  Pulse: 58  Temp: 98.6 F (37 C)  Resp: 18   SpO2 Readings from Last 1 Encounters:  09/07/15 97%   Physical Exam  Constitutional: She is well-developed, well-nourished, and in no distress.  HENT:  Head: Atraumatic.  Right Ear: Tympanic membrane and ear canal normal.  Left Ear: Tympanic membrane and ear canal normal.  Nose: Mucosal edema present. No rhinorrhea. No epistaxis.  Mouth/Throat: Oropharynx is clear and moist and mucous membranes are normal. No oropharyngeal exudate, posterior oropharyngeal edema or posterior oropharyngeal erythema.  Neck: Neck supple.  Cardiovascular: Normal rate, S1 normal and S2 normal.   No murmur heard. Pulmonary/Chest: Effort normal. She has no wheezes. She has no rhonchi. She has no rales.  Lymphadenopathy:    She has no cervical adenopathy.    Diagnostics: Spirometry: FVC 4.11--98%, FEV1 3.70-113%.  Labs dated:  October 7th=elevated specific IgE for Lamb (3.16), pork (2.85), beef (4.50), and alpha 1,3 galactose(5.97) and total IgE=126, normal tryptase and normal TSH.    Latoya Negron M. Willa RoughHicks, MD  cc: Latoya Reeves, KEVIN, MD

## 2015-09-07 NOTE — Patient Instructions (Addendum)
  1. 100% avoidance of all beef, pork, lamb and deer products.  2.  Emergency action plan--EpiPen and Benadryl.  3.  Continue Zyrtec 10 mg once daily.   4.  ProAir and Rhinocort AQ as needed.  5.  Follow-up in 7 months or sooner if needed.   Dr. Dietrich Pateshomas Platts-Mills----[employer]

## 2016-04-25 ENCOUNTER — Ambulatory Visit: Payer: BLUE CROSS/BLUE SHIELD | Admitting: Allergy and Immunology

## 2017-04-27 DIAGNOSIS — N926 Irregular menstruation, unspecified: Secondary | ICD-10-CM | POA: Diagnosis not present

## 2017-04-27 DIAGNOSIS — Z3202 Encounter for pregnancy test, result negative: Secondary | ICD-10-CM | POA: Diagnosis not present

## 2017-04-27 DIAGNOSIS — O021 Missed abortion: Secondary | ICD-10-CM | POA: Diagnosis not present

## 2017-07-23 DIAGNOSIS — N96 Recurrent pregnancy loss: Secondary | ICD-10-CM | POA: Diagnosis not present

## 2017-07-23 DIAGNOSIS — N939 Abnormal uterine and vaginal bleeding, unspecified: Secondary | ICD-10-CM | POA: Diagnosis not present

## 2017-08-10 DIAGNOSIS — N96 Recurrent pregnancy loss: Secondary | ICD-10-CM | POA: Diagnosis not present

## 2017-08-13 DIAGNOSIS — Z6830 Body mass index (BMI) 30.0-30.9, adult: Secondary | ICD-10-CM | POA: Diagnosis not present

## 2017-08-13 DIAGNOSIS — S29012A Strain of muscle and tendon of back wall of thorax, initial encounter: Secondary | ICD-10-CM | POA: Diagnosis not present

## 2017-08-20 DIAGNOSIS — N912 Amenorrhea, unspecified: Secondary | ICD-10-CM | POA: Diagnosis not present

## 2017-08-27 DIAGNOSIS — N912 Amenorrhea, unspecified: Secondary | ICD-10-CM | POA: Diagnosis not present

## 2017-09-07 DIAGNOSIS — N96 Recurrent pregnancy loss: Secondary | ICD-10-CM | POA: Diagnosis not present

## 2017-09-07 DIAGNOSIS — N925 Other specified irregular menstruation: Secondary | ICD-10-CM | POA: Diagnosis not present

## 2017-09-07 DIAGNOSIS — O200 Threatened abortion: Secondary | ICD-10-CM | POA: Diagnosis not present

## 2017-09-12 DIAGNOSIS — O021 Missed abortion: Secondary | ICD-10-CM | POA: Diagnosis not present

## 2017-09-27 ENCOUNTER — Encounter (HOSPITAL_BASED_OUTPATIENT_CLINIC_OR_DEPARTMENT_OTHER): Payer: Self-pay | Admitting: *Deleted

## 2017-10-01 ENCOUNTER — Encounter (HOSPITAL_BASED_OUTPATIENT_CLINIC_OR_DEPARTMENT_OTHER): Payer: Self-pay | Admitting: *Deleted

## 2017-10-03 ENCOUNTER — Encounter (HOSPITAL_COMMUNITY)
Admission: RE | Admit: 2017-10-03 | Discharge: 2017-10-03 | Disposition: A | Discharge status: Home / Self Care | Payer: 59 | Source: Ambulatory Visit | Attending: Obstetrics and Gynecology | Admitting: Obstetrics and Gynecology

## 2017-10-03 ENCOUNTER — Other Ambulatory Visit: Payer: Self-pay | Admitting: Obstetrics and Gynecology

## 2017-10-03 ENCOUNTER — Encounter (HOSPITAL_BASED_OUTPATIENT_CLINIC_OR_DEPARTMENT_OTHER): Payer: Self-pay | Admitting: *Deleted

## 2017-10-03 ENCOUNTER — Other Ambulatory Visit: Payer: Self-pay

## 2017-10-03 DIAGNOSIS — Z840 Family history of diseases of the skin and subcutaneous tissue: Secondary | ICD-10-CM | POA: Diagnosis not present

## 2017-10-03 DIAGNOSIS — Z7982 Long term (current) use of aspirin: Secondary | ICD-10-CM | POA: Diagnosis not present

## 2017-10-03 DIAGNOSIS — Z9049 Acquired absence of other specified parts of digestive tract: Secondary | ICD-10-CM | POA: Diagnosis not present

## 2017-10-03 DIAGNOSIS — Z9889 Other specified postprocedural states: Secondary | ICD-10-CM | POA: Diagnosis not present

## 2017-10-03 DIAGNOSIS — Z87891 Personal history of nicotine dependence: Secondary | ICD-10-CM | POA: Diagnosis not present

## 2017-10-03 DIAGNOSIS — N96 Recurrent pregnancy loss: Secondary | ICD-10-CM | POA: Diagnosis not present

## 2017-10-03 DIAGNOSIS — Z87892 Personal history of anaphylaxis: Secondary | ICD-10-CM | POA: Diagnosis not present

## 2017-10-03 DIAGNOSIS — Z8489 Family history of other specified conditions: Secondary | ICD-10-CM | POA: Diagnosis not present

## 2017-10-03 DIAGNOSIS — N858 Other specified noninflammatory disorders of uterus: Secondary | ICD-10-CM | POA: Diagnosis not present

## 2017-10-03 DIAGNOSIS — Z91018 Allergy to other foods: Secondary | ICD-10-CM | POA: Diagnosis not present

## 2017-10-03 LAB — BASIC METABOLIC PANEL
Anion gap: 6 (ref 5–15)
BUN: 13 mg/dL (ref 6–20)
CHLORIDE: 104 mmol/L (ref 101–111)
CO2: 24 mmol/L (ref 22–32)
CREATININE: 0.56 mg/dL (ref 0.44–1.00)
Calcium: 9.2 mg/dL (ref 8.9–10.3)
GFR calc Af Amer: >60 mL/min (ref >60)
GFR calc non Af Amer: >60 mL/min (ref >60)
Glucose, Bld: 93 mg/dL (ref 65–99)
Potassium: 3.5 mmol/L (ref 3.5–5.1)
SODIUM: 134 mmol/L — AB (ref 135–145)

## 2017-10-03 LAB — TYPE AND SCREEN
ABO/RH(D): A POS
Antibody Screen: NEGATIVE

## 2017-10-03 LAB — CBC
HCT: 39.4 % (ref 36.0–46.0)
Hemoglobin: 13.6 g/dL (ref 12.0–15.0)
MCH: 30.6 pg (ref 26.0–34.0)
MCHC: 34.5 g/dL (ref 30.0–36.0)
MCV: 88.7 fL (ref 78.0–100.0)
PLATELETS: 294 10*3/uL (ref 150–400)
RBC: 4.44 MIL/uL (ref 3.87–5.11)
RDW: 11.9 % (ref 11.5–15.5)
WBC: 8.3 10*3/uL (ref 4.0–10.5)

## 2017-10-03 NOTE — Progress Notes (Signed)
SPOKE W/ PT VIA PHONE FOR PRE-OP INTERVIEW.  NPO AFTER MN W/ EXCEPTION CLEAR LIQUIDS UNTIL 0830 (NO CREAM/ MILK PRODUCTS).  ARRIVE AT 1230.  NEEDS URINE PREG.  GETTING LAB WORK DONE TODAY 1430 (CBC,BMET,T&S).

## 2017-10-04 LAB — ABO/RH: ABO/RH(D): A POS

## 2017-10-05 ENCOUNTER — Ambulatory Visit (HOSPITAL_BASED_OUTPATIENT_CLINIC_OR_DEPARTMENT_OTHER): Payer: 59 | Admitting: Anesthesiology

## 2017-10-05 ENCOUNTER — Other Ambulatory Visit: Payer: Self-pay

## 2017-10-05 ENCOUNTER — Encounter (HOSPITAL_BASED_OUTPATIENT_CLINIC_OR_DEPARTMENT_OTHER)
Admission: RE | Disposition: A | Discharge status: Home / Self Care | Payer: Self-pay | Source: Ambulatory Visit | Attending: Obstetrics and Gynecology

## 2017-10-05 ENCOUNTER — Ambulatory Visit (HOSPITAL_BASED_OUTPATIENT_CLINIC_OR_DEPARTMENT_OTHER)
Admission: RE | Admit: 2017-10-05 | Discharge: 2017-10-05 | Disposition: A | Discharge status: Home / Self Care | Payer: 59 | Source: Ambulatory Visit | Attending: Obstetrics and Gynecology | Admitting: Obstetrics and Gynecology

## 2017-10-05 ENCOUNTER — Encounter (HOSPITAL_BASED_OUTPATIENT_CLINIC_OR_DEPARTMENT_OTHER): Payer: Self-pay | Admitting: *Deleted

## 2017-10-05 DIAGNOSIS — Z91018 Allergy to other foods: Secondary | ICD-10-CM | POA: Insufficient documentation

## 2017-10-05 DIAGNOSIS — Z9889 Other specified postprocedural states: Secondary | ICD-10-CM | POA: Insufficient documentation

## 2017-10-05 DIAGNOSIS — Z840 Family history of diseases of the skin and subcutaneous tissue: Secondary | ICD-10-CM | POA: Diagnosis not present

## 2017-10-05 DIAGNOSIS — N858 Other specified noninflammatory disorders of uterus: Secondary | ICD-10-CM | POA: Diagnosis not present

## 2017-10-05 DIAGNOSIS — Z8489 Family history of other specified conditions: Secondary | ICD-10-CM | POA: Insufficient documentation

## 2017-10-05 DIAGNOSIS — Z9049 Acquired absence of other specified parts of digestive tract: Secondary | ICD-10-CM | POA: Insufficient documentation

## 2017-10-05 DIAGNOSIS — Z7982 Long term (current) use of aspirin: Secondary | ICD-10-CM | POA: Diagnosis not present

## 2017-10-05 DIAGNOSIS — N96 Recurrent pregnancy loss: Secondary | ICD-10-CM | POA: Diagnosis not present

## 2017-10-05 DIAGNOSIS — Z87892 Personal history of anaphylaxis: Secondary | ICD-10-CM | POA: Diagnosis not present

## 2017-10-05 DIAGNOSIS — O021 Missed abortion: Secondary | ICD-10-CM | POA: Diagnosis not present

## 2017-10-05 DIAGNOSIS — Z87891 Personal history of nicotine dependence: Secondary | ICD-10-CM | POA: Insufficient documentation

## 2017-10-05 HISTORY — DX: Recurrent pregnancy loss: N96

## 2017-10-05 HISTORY — DX: Allergy to other foods: Z91.018

## 2017-10-05 HISTORY — PX: HYSTEROSCOPY: SHX211

## 2017-10-05 HISTORY — DX: Other allergic rhinitis: J30.89

## 2017-10-05 LAB — POCT PREGNANCY, URINE: Preg Test, Ur: NEGATIVE

## 2017-10-05 SURGERY — HYSTEROSCOPY
Anesthesia: General

## 2017-10-05 MED ORDER — ONDANSETRON HCL 4 MG/2ML IJ SOLN
INTRAMUSCULAR | Status: AC
Start: 2017-10-05 — End: 2017-10-05
  Filled 2017-10-05: qty 2

## 2017-10-05 MED ORDER — IBUPROFEN 600 MG PO TABS: 600.0000 mg | ORAL_TABLET | Freq: Four times a day (QID) | ORAL | 0 refills | Status: DC | PRN

## 2017-10-05 MED ORDER — FENTANYL CITRATE (PF) 100 MCG/2ML IJ SOLN
INTRAMUSCULAR | Status: AC
  Filled 2017-10-05: qty 2

## 2017-10-05 MED ORDER — MIDAZOLAM HCL 2 MG/2ML IJ SOLN
INTRAMUSCULAR | Status: DC | PRN
  Administered 2017-10-05: 2 mg via INTRAVENOUS

## 2017-10-05 MED ORDER — PROPOFOL 10 MG/ML IV BOLUS
INTRAVENOUS | Status: AC
  Filled 2017-10-05: qty 20

## 2017-10-05 MED ORDER — SCOPOLAMINE 1 MG/3DAYS TD PT72
MEDICATED_PATCH | TRANSDERMAL | Status: AC
  Filled 2017-10-05: qty 1

## 2017-10-05 MED ORDER — LIDOCAINE HCL 1 % IJ SOLN
INTRAMUSCULAR | Status: DC | PRN
  Administered 2017-10-05: 10 mL

## 2017-10-05 MED ORDER — ACETAMINOPHEN 10 MG/ML IV SOLN
1000.0000 mg | Freq: Once | INTRAVENOUS | Status: DC | PRN
  Filled 2017-10-05: qty 100

## 2017-10-05 MED ORDER — HYDROCODONE-ACETAMINOPHEN 5-325 MG PO TABS: ORAL_TABLET | ORAL | 0 refills | Status: DC

## 2017-10-05 MED ORDER — KETOROLAC TROMETHAMINE 30 MG/ML IJ SOLN
INTRAMUSCULAR | Status: DC | PRN
  Administered 2017-10-05: 30 mg via INTRAVENOUS

## 2017-10-05 MED ORDER — ACETAMINOPHEN 500 MG PO TABS
ORAL_TABLET | ORAL | Status: AC
  Filled 2017-10-05: qty 2

## 2017-10-05 MED ORDER — SODIUM CHLORIDE 0.9 % IR SOLN
Status: DC | PRN
  Administered 2017-10-05: 3000 mL

## 2017-10-05 MED ORDER — GABAPENTIN 300 MG PO CAPS
ORAL_CAPSULE | ORAL | Status: AC
  Filled 2017-10-05: qty 1

## 2017-10-05 MED ORDER — LACTATED RINGERS IV SOLN
INTRAVENOUS | Status: DC
  Administered 2017-10-05 (×2): via INTRAVENOUS
  Filled 2017-10-05: qty 1000

## 2017-10-05 MED ORDER — MIDAZOLAM HCL 2 MG/2ML IJ SOLN
INTRAMUSCULAR | Status: AC
  Filled 2017-10-05: qty 2

## 2017-10-05 MED ORDER — HYDROCODONE-ACETAMINOPHEN 7.5-325 MG PO TABS
1.0000 | ORAL_TABLET | Freq: Once | ORAL | Status: DC | PRN
  Filled 2017-10-05: qty 1

## 2017-10-05 MED ORDER — PROPOFOL 10 MG/ML IV BOLUS
INTRAVENOUS | Status: DC | PRN
  Administered 2017-10-05: 200 mg via INTRAVENOUS

## 2017-10-05 MED ORDER — HYDROMORPHONE HCL 1 MG/ML IJ SOLN
0.2500 mg | INTRAMUSCULAR | Status: DC | PRN
  Administered 2017-10-05: 0.5 mg via INTRAVENOUS
  Filled 2017-10-05: qty 0.5

## 2017-10-05 MED ORDER — LACTATED RINGERS IV SOLN
INTRAVENOUS | Status: DC
  Filled 2017-10-05: qty 1000

## 2017-10-05 MED ORDER — LIDOCAINE 2% (20 MG/ML) 5 ML SYRINGE
INTRAMUSCULAR | Status: DC | PRN
  Administered 2017-10-05: 80 mg via INTRAVENOUS

## 2017-10-05 MED ORDER — DEXAMETHASONE SODIUM PHOSPHATE 10 MG/ML IJ SOLN
INTRAMUSCULAR | Status: DC | PRN
  Administered 2017-10-05: 10 mg via INTRAVENOUS

## 2017-10-05 MED ORDER — DEXAMETHASONE SODIUM PHOSPHATE 10 MG/ML IJ SOLN
INTRAMUSCULAR | Status: AC
  Filled 2017-10-05: qty 1

## 2017-10-05 MED ORDER — GABAPENTIN 300 MG PO CAPS
300.0000 mg | ORAL_CAPSULE | Freq: Once | ORAL | Status: AC
  Administered 2017-10-05: 300 mg via ORAL
  Filled 2017-10-05: qty 1

## 2017-10-05 MED ORDER — HYDROCODONE-ACETAMINOPHEN 5-325 MG PO TABS
ORAL_TABLET | ORAL | Status: AC
  Filled 2017-10-05: qty 1

## 2017-10-05 MED ORDER — KETOROLAC TROMETHAMINE 30 MG/ML IJ SOLN
INTRAMUSCULAR | Status: AC
  Filled 2017-10-05: qty 1

## 2017-10-05 MED ORDER — MEPERIDINE HCL 25 MG/ML IJ SOLN
6.2500 mg | INTRAMUSCULAR | Status: DC | PRN
  Filled 2017-10-05: qty 1

## 2017-10-05 MED ORDER — FENTANYL CITRATE (PF) 100 MCG/2ML IJ SOLN
INTRAMUSCULAR | Status: DC | PRN
  Administered 2017-10-05 (×2): 50 ug via INTRAVENOUS

## 2017-10-05 MED ORDER — HYDROMORPHONE HCL 1 MG/ML IJ SOLN
INTRAMUSCULAR | Status: AC
  Filled 2017-10-05: qty 1

## 2017-10-05 MED ORDER — ACETAMINOPHEN 500 MG PO TABS
1000.0000 mg | ORAL_TABLET | Freq: Four times a day (QID) | ORAL | Status: DC | PRN
  Administered 2017-10-05: 1000 mg via ORAL
  Filled 2017-10-05: qty 2

## 2017-10-05 MED ORDER — FENTANYL CITRATE (PF) 100 MCG/2ML IJ SOLN
INTRAMUSCULAR | Status: AC
Start: 2017-10-05 — End: 2017-10-05
  Filled 2017-10-05: qty 2

## 2017-10-05 MED ORDER — HYDROCODONE-ACETAMINOPHEN 5-325 MG PO TABS
1.0000 | ORAL_TABLET | Freq: Once | ORAL | Status: AC
Start: 2017-10-05 — End: 2017-10-05
  Administered 2017-10-05: 1 via ORAL
  Filled 2017-10-05: qty 1

## 2017-10-05 MED ORDER — LIDOCAINE 2% (20 MG/ML) 5 ML SYRINGE
INTRAMUSCULAR | Status: AC
  Filled 2017-10-05: qty 5

## 2017-10-05 MED ORDER — SCOPOLAMINE 1 MG/3DAYS TD PT72
1.0000 | MEDICATED_PATCH | TRANSDERMAL | Status: DC
  Administered 2017-10-05: 1.5 mg via TRANSDERMAL
  Filled 2017-10-05: qty 1

## 2017-10-05 MED ORDER — PROMETHAZINE HCL 25 MG/ML IJ SOLN
6.2500 mg | INTRAMUSCULAR | Status: DC | PRN
  Filled 2017-10-05: qty 1

## 2017-10-05 MED ORDER — ONDANSETRON HCL 4 MG/2ML IJ SOLN
INTRAMUSCULAR | Status: DC | PRN
  Administered 2017-10-05: 4 mg via INTRAVENOUS

## 2017-10-05 MED FILL — IBUPROFEN 600 MG TABLET: 600 | 22 days supply | Qty: 90 | Fill #0

## 2017-10-05 MED FILL — HYDROCODON-APAP 5-325: 5-325 | 1 days supply | Qty: 12 | Fill #0

## 2017-10-05 SURGICAL SUPPLY — 20 items
ABLATOR ENDOMETRIAL BIPOLAR (ABLATOR) IMPLANT
CANISTER SUCT 3000ML PPV (MISCELLANEOUS) ×2 IMPLANT
CATH ROBINSON RED A/P 16FR (CATHETERS) ×2 IMPLANT
CLOTH BEACON ORANGE TIMEOUT ST (SAFETY) ×2 IMPLANT
CONTAINER PREFILL 10% NBF 60ML (FORM) ×4 IMPLANT
COUNTER NEEDLE 1200 MAGNETIC (NEEDLE) ×2 IMPLANT
DEVICE MYOSURE LITE (MISCELLANEOUS) IMPLANT
DEVICE MYOSURE REACH (MISCELLANEOUS) IMPLANT
DILATOR CANAL MILEX (MISCELLANEOUS) IMPLANT
FILTER ARTHROSCOPY CONVERTOR (FILTER) IMPLANT
GLOVE BIO SURGEON STRL SZ7 (GLOVE) ×2 IMPLANT
GLOVE BIOGEL PI IND STRL 7.0 (GLOVE) ×1 IMPLANT
GLOVE BIOGEL PI INDICATOR 7.0 (GLOVE) ×1
GOWN STRL REUS W/TWL LRG LVL3 (GOWN DISPOSABLE) ×4 IMPLANT
PACK VAGINAL MINOR WOMEN LF (CUSTOM PROCEDURE TRAY) ×2 IMPLANT
PAD OB MATERNITY 4.3X12.25 (PERSONAL CARE ITEMS) ×2 IMPLANT
SEAL ROD LENS SCOPE MYOSURE (ABLATOR) IMPLANT
TOWEL OR 17X24 6PK STRL BLUE (TOWEL DISPOSABLE) ×4 IMPLANT
TUBING AQUILEX INFLOW (TUBING) ×2 IMPLANT
TUBING AQUILEX OUTFLOW (TUBING) ×2 IMPLANT

## 2017-10-05 NOTE — Anesthesia Procedure Notes (Signed)
Procedure Name: LMA Insertion Date/Time: 10/05/2017 2:27 PM Performed by: Francie MassingHazel, Aylah Yeary D, CRNA Pre-anesthesia Checklist: Patient identified, Emergency Drugs available, Suction available and Patient being monitored Patient Re-evaluated:Patient Re-evaluated prior to induction Oxygen Delivery Method: Circle system utilized Preoxygenation: Pre-oxygenation with 100% oxygen Induction Type: IV induction Ventilation: Mask ventilation without difficulty LMA: LMA inserted LMA Size: 4.0 Number of attempts: 1 Airway Equipment and Method: Bite block Placement Confirmation: positive ETCO2 Tube secured with: Tape Dental Injury: Teeth and Oropharynx as per pre-operative assessment

## 2017-10-05 NOTE — Op Note (Signed)
Pre-Operative Diagnosis: 1) recurrent miscarriage Postoperative Diagnosis:  1) recurrent miscarriage Procedure: Hysteroscopy, dilation and curettage Surgeon: Dr. Waynard ReedsKendra Sinjin Amero Assistant: None Operative Findings: Normal-appearing endometrial cavity.  No evidence of uterine septum noted.  Lateral tubal ostia were visualized. Specimen: Endometrial curettings EBL: Total I/O In: 1000 [I.V.:1000] Out: 13 [Urine:3; Blood:10]   Ms. Latoya Reeves Is a 27 year old gravida 4 para 0040 who presents for definitive surgical management for recurrent pregnancy loss. Please see the patient's history and physical for complete details of the history. Management options were discussed with the patient. R/B/A reviewed. Following appropriate informed consent was taken to the operating room. The patient was appropriately identified during a time out procedure.  General anesthesia was administered and the patient was placed in the dorsal lithotomy position. The patient was prepped and draped in the normal sterile fashion. A speculum was placed into the vagina, a single-tooth tenaculum was placed on the anterior lip of the cervix, and 10 cc of 1% lidocaine was administered in a paracervical fashion. The cervix was serially dilated with Hank dilators.  The hysteroscope was introduced for the above findings.  Hysteroscope was removed and a sharp curettage was performed.  The endometrial cavity was again revisualized and found again to be normal-appearing.  The hysteroscope was then removed.  One final sharp curettage was performed to remove any residual shaggy endometrium.  This then completed the surgical procedure.  The single-tooth tenaculum was removed from the anterior lip of the cervix and the speculum was removed from the vagina.  All sponge, lap, needle counts were correct.  The patient was taken to the PACU in stable condition following the procedure.

## 2017-10-05 NOTE — Discharge Instructions (Signed)

## 2017-10-05 NOTE — H&P (Signed)
Latoya Reeves is an 27 y.o. female.  27 yo G4P0040 presents for surgical evaluation for recurrent pregnancy loss. The patient had 3 consecutive chemical pregnancies followed by a 6 week spontaneous miscarriage. PRL evaluation to th\is point has been negative. The patient is here for hysteroscopy, D&C & possible resection of a uterine septum. R/B/A of the procedure were discussed with the patient at length and she wishes to proceed.  No LMP recorded (lmp unknown).    Past Medical History:  Diagnosis Date  . Allergy to alpha-gal    alpha gal mammalian allergy---- beef , pork, lamb, mutton, deer  . Eczema   . Environmental and seasonal allergies   . History of recurrent miscarriages   . Perennial allergic rhinitis     Past Surgical History:  Procedure Laterality Date  . LAPAROSCOPIC CHOLECYSTECTOMY  12/2012  . TYMPANOSTOMY TUBE PLACEMENT Bilateral child    Family History  Problem Relation Age of Onset  . Eczema Father   . Allergic rhinitis Sister   . Other Sister        kiwi- lip and tongue swelling    Social History:  reports that she quit smoking about 4 years ago. Her smoking use included cigarettes. She quit after 0.50 years of use. she has never used smokeless tobacco. She reports that she does not drink alcohol or use drugs.  Allergies:  Allergies  Allergen Reactions  . Other Anaphylaxis    ALPHA-GAL (BEEF, PORK, LAMB, DEER, MUTTON).  REACTION ANAPHYLAXIS, HIVES, GI ISSUES.    Medications Prior to Admission  Medication Sig Dispense Refill Last Dose  . aspirin EC 81 MG tablet Take 81 mg by mouth daily.   Past Week at Unknown time  . FOLIC ACID PO Take 1 tablet by mouth daily.   10/04/2017 at Unknown time  . EPINEPHrine 0.3 mg/0.3 mL IJ SOAJ injection Inject 0.3 mLs (0.3 mg total) into the muscle once. 2 Device 1 Unknown at Unknown time    ROS  Blood pressure 121/77, pulse (!) 58, temperature 98.6 F (37 C), temperature source Oral, resp. rate 16, height 5\' 6"   (1.676 m), weight 88.5 kg (195 lb), SpO2 100 %. Physical Exam   AOx3, NAD Abd Soft Normal work of breathing  Results for orders placed or performed during the hospital encounter of 10/05/17 (from the past 24 hour(s))  Pregnancy, urine POC     Status: None   Collection Time: 10/05/17 12:28 PM  Result Value Ref Range   Preg Test, Ur NEGATIVE NEGATIVE    No results found.  Assessment/Plan: 1) Admit 2) SCDs 3) Proceed with hysteroscopy and possible resection of uterine septum  Waynard ReedsKendra Shahidah Nesbitt 10/05/2017, 2:08 PM

## 2017-10-05 NOTE — Anesthesia Preprocedure Evaluation (Addendum)
Anesthesia Evaluation  Patient identified by MRN, date of birth, ID band Patient awake    Reviewed: Allergy & Precautions, NPO status , Patient's Chart, lab work & pertinent test results  Airway Mallampati: I  TM Distance: >3 FB Neck ROM: Full    Dental no notable dental hx.    Pulmonary neg pulmonary ROS, former smoker,    Pulmonary exam normal breath sounds clear to auscultation       Cardiovascular negative cardio ROS Normal cardiovascular exam Rhythm:Regular Rate:Normal     Neuro/Psych negative neurological ROS  negative psych ROS   GI/Hepatic negative GI ROS, Neg liver ROS,   Endo/Other  negative endocrine ROS  Renal/GU negative Renal ROS  negative genitourinary   Musculoskeletal negative musculoskeletal ROS (+)   Abdominal   Peds  Hematology negative hematology ROS (+)   Anesthesia Other Findings   Reproductive/Obstetrics negative OB ROS                            Anesthesia Physical Anesthesia Plan  ASA: I  Anesthesia Plan: General   Post-op Pain Management:    Induction: Intravenous  PONV Risk Score and Plan: 4 or greater and Ondansetron, Dexamethasone, Scopolamine patch - Pre-op and Midazolam  Airway Management Planned: LMA  Additional Equipment:   Intra-op Plan:   Post-operative Plan: Extubation in OR  Informed Consent: I have reviewed the patients History and Physical, chart, labs and discussed the procedure including the risks, benefits and alternatives for the proposed anesthesia with the patient or authorized representative who has indicated his/her understanding and acceptance.   Dental advisory given  Plan Discussed with: CRNA  Anesthesia Plan Comments:        Lab Results  Component Value Date   PREGTESTUR NEGATIVE 10/05/2017    Anesthesia Quick Evaluation Lab Results  Component Value Date   WBC 8.3 10/03/2017   HGB 13.6 10/03/2017   HCT  39.4 10/03/2017   MCV 88.7 10/03/2017   PLT 294 10/03/2017

## 2017-10-08 ENCOUNTER — Encounter (HOSPITAL_BASED_OUTPATIENT_CLINIC_OR_DEPARTMENT_OTHER): Payer: Self-pay | Admitting: Obstetrics and Gynecology

## 2017-10-08 NOTE — Transfer of Care (Signed)
Immediate Anesthesia Transfer of Care Note  Patient: Latoya Reeves  Procedure(s) Performed: HYSTEROSCOPY, DILATION AND CURETTAGE (N/A )  Patient Location: PACU  Anesthesia Type:General  Level of Consciousness: responds to stimulation  Airway & Oxygen Therapy: Patient Spontanous Breathing and Patient connected to nasal cannula oxygen  Post-op Assessment: Report given to RN and Post -op Vital signs reviewed and stable  Post vital signs: Reviewed  Last Vitals:  Vitals:   10/05/17 1545 10/05/17 1645  BP: 121/73 123/83  Pulse: (!) 54 65  Resp: 13 14  Temp:  36.5 C  SpO2: 99% 99%    Last Pain:  Vitals:   10/05/17 1645  TempSrc: Oral  PainSc:       Patients Stated Pain Goal: 5 (10/05/17 1612)  Complications: No apparent anesthesia complications

## 2017-10-08 NOTE — Anesthesia Postprocedure Evaluation (Signed)
Anesthesia Post Note  Patient: Deboraha SprangEmilee B Ishii  Procedure(s) Performed: HYSTEROSCOPY, DILATION AND CURETTAGE (N/A )     Patient location during evaluation: PACU Anesthesia Type: General Level of consciousness: awake and alert Pain management: pain level controlled Vital Signs Assessment: post-procedure vital signs reviewed and stable Respiratory status: spontaneous breathing, nonlabored ventilation, respiratory function stable and patient connected to nasal cannula oxygen Cardiovascular status: blood pressure returned to baseline and stable Postop Assessment: no apparent nausea or vomiting Anesthetic complications: no    Last Vitals:  Vitals:   10/05/17 1545 10/05/17 1645  BP: 121/73 123/83  Pulse: (!) 54 65  Resp: 13 14  Temp:  36.5 C  SpO2: 99% 99%    Last Pain:  Vitals:   10/05/17 1645  TempSrc: Oral  PainSc:                  Chancey Ringel P Taunja Brickner

## 2017-10-17 DIAGNOSIS — Z09 Encounter for follow-up examination after completed treatment for conditions other than malignant neoplasm: Secondary | ICD-10-CM | POA: Diagnosis not present

## 2017-10-17 DIAGNOSIS — N96 Recurrent pregnancy loss: Secondary | ICD-10-CM | POA: Diagnosis not present

## 2017-10-24 ENCOUNTER — Telehealth: Payer: 59 | Admitting: Family

## 2017-10-24 DIAGNOSIS — J209 Acute bronchitis, unspecified: Secondary | ICD-10-CM | POA: Diagnosis not present

## 2017-10-24 MED ORDER — PREDNISONE 10 MG (21) PO TBPK: ORAL_TABLET | ORAL | 0 refills | Status: DC

## 2017-10-24 MED ORDER — BENZONATATE 100 MG PO CAPS: 100.0000 mg | ORAL_CAPSULE | Freq: Three times a day (TID) | ORAL | 0 refills | Status: DC | PRN

## 2017-10-24 MED FILL — BENZONATATE 100 MG CAP: 100 | 6 days supply | Qty: 20 | Fill #0

## 2017-10-24 MED FILL — predniSONE 10 MG (21) TBPK: 10 | 6 days supply | Qty: 21 | Fill #0

## 2017-10-24 NOTE — Progress Notes (Signed)
We are sorry that you are not feeling well.  Here is how we plan to help!  Based on your presentation I believe you most likely have A cough due to a virus.  This is called viral bronchitis and is best treated by rest, plenty of fluids and control of the cough.  You may use Ibuprofen or Tylenol as directed to help your symptoms.     In addition you may use A non-prescription cough medication called Robitussin DAC. Take 2 teaspoons every 8 hours or Delsym: take 2 teaspoons every 12 hours., A non-prescription cough medication called Mucinex DM: take 2 tablets every 12 hours. and A prescription cough medication called Tessalon Perles 100mg . You may take 1-2 capsules every 8 hours as needed for your cough.   I have also sent in a prescription for a steroid dose pack, Sterapred 10 mg dosepak.  From your responses in the eVisit questionnaire you describe inflammation in the upper respiratory tract which is causing a significant cough.  This is commonly called Bronchitis and has four common causes:    Allergies  Viral Infections  Acid Reflux  Bacterial Infection Allergies, viruses and acid reflux are treated by controlling symptoms or eliminating the cause. An example might be a cough caused by taking certain blood pressure medications. You stop the cough by changing the medication. Another example might be a cough caused by acid reflux. Controlling the reflux helps control the cough.  USE OF BRONCHODILATOR ("RESCUE") INHALERS: There is a risk from using your bronchodilator too frequently.  The risk is that over-reliance on a medication which only relaxes the muscles surrounding the breathing tubes can reduce the effectiveness of medications prescribed to reduce swelling and congestion of the tubes themselves.  Although you feel brief relief from the bronchodilator inhaler, your asthma may actually be worsening with the tubes becoming more swollen and filled with mucus.  This can delay other crucial  treatments, such as oral steroid medications. If you need to use a bronchodilator inhaler daily, several times per day, you should discuss this with your provider.  There are probably better treatments that could be used to keep your asthma under control.     HOME CARE . Only take medications as instructed by your medical team. . Complete the entire course of an antibiotic. . Drink plenty of fluids and get plenty of rest. . Avoid close contacts especially the very young and the elderly . Cover your mouth if you cough or cough into your sleeve. . Always remember to wash your hands . A steam or ultrasonic humidifier can help congestion.   GET HELP RIGHT AWAY IF: . You develop worsening fever. . You become short of breath . You cough up blood. . Your symptoms persist after you have completed your treatment plan MAKE SURE YOU   Understand these instructions.  Will watch your condition.  Will get help right away if you are not doing well or get worse.  Your e-visit answers were reviewed by a board certified advanced clinical practitioner to complete your personal care plan.  Depending on the condition, your plan could have included both over the counter or prescription medications. If there is a problem please reply  once you have received a response from your provider. Your safety is important to us.  If you have drug allergies check your prescription carefully.    You can use MyChart to ask questions about today's visit, request a non-urgent call back, or ask for a work  or school excuse for 24 hours related to this e-Visit. If it has been greater than 24 hours you will need to follow up with your provider, or enter a new e-Visit to address those concerns. You will get an e-mail in the next two days asking about your experience.  I hope that your e-visit has been valuable and will speed your recovery. Thank you for using e-visits.

## 2017-12-06 DIAGNOSIS — N912 Amenorrhea, unspecified: Secondary | ICD-10-CM | POA: Diagnosis not present

## 2017-12-07 DIAGNOSIS — O200 Threatened abortion: Secondary | ICD-10-CM | POA: Diagnosis not present

## 2018-01-04 DIAGNOSIS — Z3169 Encounter for other general counseling and advice on procreation: Secondary | ICD-10-CM | POA: Diagnosis not present

## 2018-01-04 DIAGNOSIS — D6869 Other thrombophilia: Secondary | ICD-10-CM | POA: Diagnosis not present

## 2018-01-04 DIAGNOSIS — Z3141 Encounter for fertility testing: Secondary | ICD-10-CM | POA: Diagnosis not present

## 2018-01-04 DIAGNOSIS — N96 Recurrent pregnancy loss: Secondary | ICD-10-CM | POA: Diagnosis not present

## 2018-01-04 DIAGNOSIS — Z87891 Personal history of nicotine dependence: Secondary | ICD-10-CM | POA: Diagnosis not present

## 2018-01-25 MED FILL — PROGESTERONE MICRONIZED 200: 200 | 30 days supply | Qty: 30 | Fill #0

## 2018-01-28 DIAGNOSIS — Z3200 Encounter for pregnancy test, result unknown: Secondary | ICD-10-CM | POA: Diagnosis not present

## 2018-02-19 MED FILL — OVIDREL 250 MCG/0.5 ML SYRG: 250 | 1 days supply | Qty: 1 | Fill #0

## 2018-02-27 DIAGNOSIS — Z0000 Encounter for general adult medical examination without abnormal findings: Secondary | ICD-10-CM | POA: Diagnosis not present

## 2018-02-27 DIAGNOSIS — Z6828 Body mass index (BMI) 28.0-28.9, adult: Secondary | ICD-10-CM | POA: Diagnosis not present

## 2018-03-19 DIAGNOSIS — Z3200 Encounter for pregnancy test, result unknown: Secondary | ICD-10-CM | POA: Diagnosis not present

## 2018-03-21 DIAGNOSIS — Z3200 Encounter for pregnancy test, result unknown: Secondary | ICD-10-CM | POA: Diagnosis not present

## 2018-03-21 MED FILL — PROGESTERONE MICRONIZED 200: 200 | 30 days supply | Qty: 30 | Fill #1

## 2018-03-26 DIAGNOSIS — Z3200 Encounter for pregnancy test, result unknown: Secondary | ICD-10-CM | POA: Diagnosis not present

## 2018-03-29 DIAGNOSIS — Z3200 Encounter for pregnancy test, result unknown: Secondary | ICD-10-CM | POA: Diagnosis not present

## 2018-04-03 DIAGNOSIS — O0901 Supervision of pregnancy with history of infertility, first trimester: Secondary | ICD-10-CM | POA: Diagnosis not present

## 2018-04-03 DIAGNOSIS — Z3A00 Weeks of gestation of pregnancy not specified: Secondary | ICD-10-CM | POA: Diagnosis not present

## 2018-04-10 DIAGNOSIS — Z3A00 Weeks of gestation of pregnancy not specified: Secondary | ICD-10-CM | POA: Diagnosis not present

## 2018-04-10 DIAGNOSIS — O0901 Supervision of pregnancy with history of infertility, first trimester: Secondary | ICD-10-CM | POA: Diagnosis not present

## 2018-04-12 MED FILL — PROGESTERONE MICRONIZED 200: 200 | 23 days supply | Qty: 45 | Fill #0

## 2018-05-03 DIAGNOSIS — Z3682 Encounter for antenatal screening for nuchal translucency: Secondary | ICD-10-CM | POA: Diagnosis not present

## 2018-05-03 DIAGNOSIS — Z3689 Encounter for other specified antenatal screening: Secondary | ICD-10-CM | POA: Diagnosis not present

## 2018-05-03 DIAGNOSIS — O0901 Supervision of pregnancy with history of infertility, first trimester: Secondary | ICD-10-CM | POA: Diagnosis not present

## 2018-05-03 LAB — OB RESULTS CONSOLE RUBELLA ANTIBODY, IGM: RUBELLA: IMMUNE

## 2018-05-03 LAB — OB RESULTS CONSOLE GC/CHLAMYDIA
Chlamydia: NEGATIVE
Gonorrhea: NEGATIVE

## 2018-05-03 LAB — OB RESULTS CONSOLE HIV ANTIBODY (ROUTINE TESTING): HIV: NONREACTIVE

## 2018-05-03 LAB — OB RESULTS CONSOLE HEPATITIS B SURFACE ANTIGEN: Hepatitis B Surface Ag: NEGATIVE

## 2018-05-07 MED FILL — PROGESTERONE MICRONIZED 200: 200 | 23 days supply | Qty: 45 | Fill #1

## 2018-05-20 DIAGNOSIS — Z3682 Encounter for antenatal screening for nuchal translucency: Secondary | ICD-10-CM | POA: Diagnosis not present

## 2018-05-28 DIAGNOSIS — R1032 Left lower quadrant pain: Secondary | ICD-10-CM | POA: Diagnosis not present

## 2018-06-11 DIAGNOSIS — Z361 Encounter for antenatal screening for raised alphafetoprotein level: Secondary | ICD-10-CM | POA: Diagnosis not present

## 2018-07-01 DIAGNOSIS — Z3A18 18 weeks gestation of pregnancy: Secondary | ICD-10-CM | POA: Diagnosis not present

## 2018-07-01 DIAGNOSIS — O358XX0 Maternal care for other (suspected) fetal abnormality and damage, not applicable or unspecified: Secondary | ICD-10-CM | POA: Diagnosis not present

## 2018-07-01 MED FILL — LIDOCAINE-HC 3-0.5% CREAM: 3-0.5 | 7 days supply | Qty: 98 | Fill #0

## 2018-07-13 ENCOUNTER — Inpatient Hospital Stay (HOSPITAL_BASED_OUTPATIENT_CLINIC_OR_DEPARTMENT_OTHER): Payer: 59

## 2018-07-13 ENCOUNTER — Inpatient Hospital Stay (HOSPITAL_COMMUNITY)
Admission: AD | Admit: 2018-07-13 | Discharge: 2018-07-13 | Disposition: A | Discharge status: Home / Self Care | Payer: 59 | Source: Ambulatory Visit | Attending: Obstetrics and Gynecology | Admitting: Obstetrics and Gynecology

## 2018-07-13 ENCOUNTER — Encounter (HOSPITAL_COMMUNITY): Payer: Self-pay | Admitting: *Deleted

## 2018-07-13 DIAGNOSIS — O99612 Diseases of the digestive system complicating pregnancy, second trimester: Secondary | ICD-10-CM | POA: Diagnosis not present

## 2018-07-13 DIAGNOSIS — Z3A20 20 weeks gestation of pregnancy: Secondary | ICD-10-CM

## 2018-07-13 DIAGNOSIS — O26892 Other specified pregnancy related conditions, second trimester: Secondary | ICD-10-CM | POA: Diagnosis not present

## 2018-07-13 DIAGNOSIS — O2622 Pregnancy care for patient with recurrent pregnancy loss, second trimester: Secondary | ICD-10-CM | POA: Diagnosis not present

## 2018-07-13 DIAGNOSIS — R109 Unspecified abdominal pain: Secondary | ICD-10-CM

## 2018-07-13 DIAGNOSIS — K529 Noninfective gastroenteritis and colitis, unspecified: Secondary | ICD-10-CM | POA: Diagnosis not present

## 2018-07-13 DIAGNOSIS — Z87891 Personal history of nicotine dependence: Secondary | ICD-10-CM | POA: Insufficient documentation

## 2018-07-13 DIAGNOSIS — N96 Recurrent pregnancy loss: Secondary | ICD-10-CM

## 2018-07-13 DIAGNOSIS — Z7982 Long term (current) use of aspirin: Secondary | ICD-10-CM | POA: Diagnosis not present

## 2018-07-13 DIAGNOSIS — O9989 Other specified diseases and conditions complicating pregnancy, childbirth and the puerperium: Secondary | ICD-10-CM

## 2018-07-13 LAB — URINALYSIS, ROUTINE W REFLEX MICROSCOPIC
Bilirubin Urine: NEGATIVE
Glucose, UA: NEGATIVE mg/dL
HGB URINE DIPSTICK: NEGATIVE
Ketones, ur: 5 mg/dL — AB
Leukocytes, UA: NEGATIVE
Nitrite: NEGATIVE
PH: 5 (ref 5.0–8.0)
Protein, ur: NEGATIVE mg/dL
SPECIFIC GRAVITY, URINE: 1.021 (ref 1.005–1.030)

## 2018-07-13 MED ORDER — SIMETHICONE 80 MG PO CHEW
80.0000 mg | CHEWABLE_TABLET | Freq: Once | ORAL | Status: AC
  Administered 2018-07-13: 80 mg via ORAL
  Filled 2018-07-13: qty 1

## 2018-07-13 MED ORDER — DICYCLOMINE HCL 20 MG PO TABS
20.0000 mg | ORAL_TABLET | Freq: Once | ORAL | Status: AC
  Administered 2018-07-13: 20 mg via ORAL
  Filled 2018-07-13: qty 1

## 2018-07-13 NOTE — Discharge Instructions (Signed)

## 2018-07-13 NOTE — MAU Provider Note (Signed)
Chief Complaint: Abdominal Pain   First Provider Initiated Contact with Patient 07/13/18 1154     SUBJECTIVE HPI: Latoya Reeves is a 27 y.o. G6P0050 at [redacted]w[redacted]d who presents to Maternity Admissions reporting abdominal pain. Symptoms began 1 hour PTA. Reports feeling intermittent lower abdominal pain that feels like gas pain. Vomited once after pain started but does not continue to be nauseated. Had a BM after pain started but symptoms did not improve. Denies fever/chills, dysuria, vaginal bleeding, vaginal discharge, or LOF.   Location: lower abdomen Quality: sharp Severity: 10/10 on pain scale Duration: 1 hour Timing: every 3 minutes Modifying factors: none Associated signs and symptoms: vomiting & diarrhea  Past Medical History:  Diagnosis Date  . Allergy to alpha-gal    alpha gal mammalian allergy---- beef , pork, lamb, mutton, deer  . Eczema   . Environmental and seasonal allergies   . History of recurrent miscarriages   . Perennial allergic rhinitis    OB History  Gravida Para Term Preterm AB Living  6       5    SAB TAB Ectopic Multiple Live Births  5            # Outcome Date GA Lbr Len/2nd Weight Sex Delivery Anes PTL Lv  6 Current           5 SAB           4 SAB           3 SAB           2 SAB           1 SAB            Past Surgical History:  Procedure Laterality Date  . HYSTEROSCOPY N/A 10/05/2017   Procedure: HYSTEROSCOPY, DILATION AND CURETTAGE;  Surgeon: Waynard Reeds, MD;  Location: [facility];  Service: Gynecology;  Laterality: N/A;  . LAPAROSCOPIC CHOLECYSTECTOMY  12/2012  . TYMPANOSTOMY TUBE PLACEMENT Bilateral child   Social History   Socioeconomic History  . Marital status: Married    Spouse name: Not on file  . Number of children: Not on file  . Years of education: Not on file  . Highest education level: Not on file  Occupational History  . Not on file  Social Needs  . Financial resource strain: Not on file  . Food insecurity:     Worry: Not on file    Inability: Not on file  . Transportation needs:    Medical: Not on file    Non-medical: Not on file  Tobacco Use  . Smoking status: Former Smoker    Years: 0.50    Types: Cigarettes    Last attempt to quit: 07/02/2013    Years since quitting: 5.0  . Smokeless tobacco: Never Used  Substance and Sexual Activity  . Alcohol use: No    Alcohol/week: 0.0 standard drinks    Frequency: Never    Comment: occasional  . Drug use: No  . Sexual activity: Not on file  Lifestyle  . Physical activity:    Days per week: Not on file    Minutes per session: Not on file  . Stress: Not on file  Relationships  . Social connections:    Talks on phone: Not on file    Gets together: Not on file    Attends religious service: Not on file    Active member of club or organization: Not on file    Attends meetings  of clubs or organizations: Not on file    Relationship status: Not on file  . Intimate partner violence:    Fear of current or ex partner: Not on file    Emotionally abused: Not on file    Physically abused: Not on file    Forced sexual activity: Not on file  Other Topics Concern  . Not on file  Social History Narrative  . Not on file   Family History  Problem Relation Age of Onset  . Eczema Father   . Allergic rhinitis Sister   . Other Sister        kiwi- lip and tongue swelling   No current facility-administered medications on file prior to encounter.    Current Outpatient Medications on File Prior to Encounter  Medication Sig Dispense Refill  . aspirin EC 81 MG tablet Take 81 mg by mouth daily.    . benzonatate (TESSALON PERLES) 100 MG capsule Take 1 capsule (100 mg total) by mouth 3 (three) times daily as needed. 20 capsule 0  . EPINEPHrine 0.3 mg/0.3 mL IJ SOAJ injection Inject 0.3 mLs (0.3 mg total) into the muscle once. 2 Device 1  . FOLIC ACID PO Take 1 tablet by mouth daily.    Marland Kitchen HYDROcodone-acetaminophen (NORCO/VICODIN) 5-325 MG tablet 1-2  tablets every 4-6 hours as needed for pain 12 tablet 0  . ibuprofen (ADVIL,MOTRIN) 600 MG tablet Take 1 tablet (600 mg total) by mouth every 6 (six) hours as needed. 90 tablet 0  . predniSONE (STERAPRED UNI-PAK 21 TAB) 10 MG (21) TBPK tablet Use as directed 21 tablet 0   Allergies  Allergen Reactions  . Other Anaphylaxis    ALPHA-GAL (BEEF, PORK, LAMB, DEER, MUTTON).  REACTION ANAPHYLAXIS, HIVES, GI ISSUES.    I have reviewed patient's Past Medical Hx, Surgical Hx, Family Hx, Social Hx, medications and allergies.   Review of Systems  Constitutional: Negative.   Gastrointestinal: Positive for abdominal pain, diarrhea and vomiting. Negative for blood in stool and nausea.  Genitourinary: Negative.     OBJECTIVE Patient Vitals for the past 24 hrs:  BP Temp Temp src Pulse Resp  07/13/18 1419 113/60 - - (!) 47 -  07/13/18 1156 127/74 97.7 F (36.5 C) Oral (!) 57 20   Constitutional: Well-developed, well-nourished female. Pt visibly uncomfortable pacing in room Cardiovascular: normal rate & rhythm, no murmur Respiratory: normal rate and effort. Lung sounds clear throughout GI: Abd soft, non-tender, Pos BS x 4. No guarding or rebound tenderness MS: Extremities nontender, no edema, normal ROM Neurologic: Alert and oriented x 4.  GU: Cervix closed   LAB RESULTS Results for orders placed or performed during the hospital encounter of 07/13/18 (from the past 24 hour(s))  Urinalysis, Routine w reflex microscopic     Status: Abnormal   Collection Time: 07/13/18 12:13 PM  Result Value Ref Range   Color, Urine YELLOW YELLOW   APPearance CLEAR CLEAR   Specific Gravity, Urine 1.021 1.005 - 1.030   pH 5.0 5.0 - 8.0   Glucose, UA NEGATIVE NEGATIVE mg/dL   Hgb urine dipstick NEGATIVE NEGATIVE   Bilirubin Urine NEGATIVE NEGATIVE   Ketones, ur 5 (A) NEGATIVE mg/dL   Protein, ur NEGATIVE NEGATIVE mg/dL   Nitrite NEGATIVE NEGATIVE   Leukocytes, UA NEGATIVE NEGATIVE    IMAGING [place] Mfm Ob  Transvaginal  Result Date: 07/13/2018 ----------------------------------------------------------------------  OBSTETRICS REPORT                       (  Signed Final 07/13/2018 03:16 pm) ---------------------------------------------------------------------- Patient Info  ID #:       [id number]                          D.O.B.:  05-14-1991 (27 yrs)  Name:       Latoya Reeves                  Visit Date: 07/13/2018 01:46 pm ---------------------------------------------------------------------- Performed By  Performed By:     Birdena Crandall        Ref. Address:     [address]                    [place]                                                             [address]  Attending:        Patsi Sears      Secondary Phy.:   MAU Nursing-                    MD                                                             MAU/Triage  Referred By:      Judeth Horn          Location:         [facility]                    CNM ---------------------------------------------------------------------- Orders   #  Description                          Code         Ordered By   1  [place] MFM OB LIMITED                    76815.01     Judeth Horn   2  [place] MFM OB TRANSVAGINAL               40981.1      Judeth Horn  ----------------------------------------------------------------------   #  Order #                    Accession #                 Episode #   1  [id number]                  [id number]   2  [id number]                  [id number]                  [id number]  ---------------------------------------------------------------------- Indications   [redacted] weeks gestation of pregnancy  Z3A.20   Abdominal pain in pregnancy                    O99.89   Poor obstetric history-Recurrent (habitual)    O26.20   abortion (3 consecutive ab's)  ---------------------------------------------------------------------- Fetal Evaluation  Num Of Fetuses:         1  Fetal Heart Rate(bpm):  135  Cardiac  Activity:       Observed  Presentation:           Variable  Placenta:               Posterior  Amniotic Fluid  AFI FV:      Within normal limits                              Largest Pocket(cm)                              7.16  Comment:    No placental abruption or previa identified. No subchorionic              hemorrhage seen. ---------------------------------------------------------------------- OB History  Gravidity:    6          SAB:   5 ---------------------------------------------------------------------- Gestational Age  LMP:           20w 4d        Date:  02/19/18                 EDD:   11/26/18  Best:          Cherylann Parr 4d     Det. By:  LMP  (02/19/18)          EDD:   11/26/18 ---------------------------------------------------------------------- Anatomy  Ventricles:            Appears normal         Bladder:                Appears normal  Choroid Plexus:        Appears normal         Upper Extremities:      Visualized  Stomach:               Appears normal, left   Lower Extremities:      Visualized                         sided  Kidneys:               Appear normal ---------------------------------------------------------------------- Targeted Anatomy  Central Nervous System  Lateral Ventricles:    7.2 mm ---------------------------------------------------------------------- Cervix Uterus Adnexa  Cervix  Length:            3.4  cm.  Appears closed, without funnelling. Normal appearance by  transvaginal scan  Uterus  No abnormality visualized.  Cul De Sac  No free fluid seen. ---------------------------------------------------------------------- Comments  Ultrasound images are reviewed.  No evidecnce of placenta  previa or abruption  Recommendations: 1) Follow-up as clinically indicated ---------------------------------------------------------------------- Recommendations  Follow-up as clinically indicated ----------------------------------------------------------------------               Patsi Sears, MD  Electronically Signed Final Report   07/13/2018 03:16 pm ----------------------------------------------------------------------  [place] Mfm Ob Limited  Result Date: 07/13/2018 ----------------------------------------------------------------------  OBSTETRICS REPORT                       (  Signed Final 07/13/2018 03:16 pm) ---------------------------------------------------------------------- Patient Info  ID #:       [id number]                          D.O.B.:  1990/11/11 (27 yrs)  Name:       Latoya Reeves                  Visit Date: 07/13/2018 01:46 pm ---------------------------------------------------------------------- Performed By  Performed By:     Birdena Crandall        Ref. Address:     [address]                    [place]                                                             [address]  Attending:        Patsi Sears      Secondary Phy.:   MAU Nursing-                    MD                                                             MAU/Triage  Referred By:      Judeth Horn          Location:         [facility]                    CNM ---------------------------------------------------------------------- Orders   #  Description                          Code         Ordered By   1  [place] MFM OB LIMITED                    76815.01     Judeth Horn   2  [place] MFM OB TRANSVAGINAL               40981.1      Judeth Horn  ----------------------------------------------------------------------   #  Order #                    Accession #                 Episode #   1  [id number]                  [id number]   2  [id number]                  [id number]                  [id number]  ---------------------------------------------------------------------- Indications   [redacted] weeks gestation of pregnancy  Z3A.20   Abdominal pain in pregnancy                    O99.89   Poor obstetric history-Recurrent (habitual)    O26.20   abortion (3 consecutive ab's)   ---------------------------------------------------------------------- Fetal Evaluation  Num Of Fetuses:         1  Fetal Heart Rate(bpm):  135  Cardiac Activity:       Observed  Presentation:           Variable  Placenta:               Posterior  Amniotic Fluid  AFI FV:      Within normal limits                              Largest Pocket(cm)                              7.16  Comment:    No placental abruption or previa identified. No subchorionic              hemorrhage seen. ---------------------------------------------------------------------- OB History  Gravidity:    6          SAB:   5 ---------------------------------------------------------------------- Gestational Age  LMP:           20w 4d        Date:  02/19/18                 EDD:   11/26/18  Best:          Cherylann Parr 4d     Det. By:  LMP  (02/19/18)          EDD:   11/26/18 ---------------------------------------------------------------------- Anatomy  Ventricles:            Appears normal         Bladder:                Appears normal  Choroid Plexus:        Appears normal         Upper Extremities:      Visualized  Stomach:               Appears normal, left   Lower Extremities:      Visualized                         sided  Kidneys:               Appear normal ---------------------------------------------------------------------- Targeted Anatomy  Central Nervous System  Lateral Ventricles:    7.2 mm ---------------------------------------------------------------------- Cervix Uterus Adnexa  Cervix  Length:            3.4  cm.  Appears closed, without funnelling. Normal appearance by  transvaginal scan  Uterus  No abnormality visualized.  Cul De Sac  No free fluid seen. ---------------------------------------------------------------------- Comments  Ultrasound images are reviewed.  No evidecnce of placenta  previa or abruption  Recommendations: 1) Follow-up as clinically indicated ----------------------------------------------------------------------  Recommendations  Follow-up as clinically indicated ----------------------------------------------------------------------               Patsi Sears, MD Electronically Signed Final Report   07/13/2018 03:16 pm ----------------------------------------------------------------------   MAU COURSE Orders Placed This Encounter  Procedures  . [place] MFM OB LIMITED  . [place]  MFM OB Transvaginal  . Urinalysis, Routine w reflex microscopic  . Discharge patient   Meds ordered this encounter  Medications  . dicyclomine (BENTYL) tablet 20 mg  . simethicone (MYLICON) chewable tablet 80 mg    MDM FHT 145 by doppler Cervix closed Ob ultrasound --- CL 3.4 cm, no evidence of abruption Bentyl & simethicone given as pt states pain feels like her previous GI issues.  Episode of diarrhea in MAU. Pt reports pain resolved after her BM. Recalls that a family member has a GI illness currently.   ASSESSMENT 1. Gastroenteritis, acute   2. History of recurrent miscarriages   3. [redacted] weeks gestation of pregnancy   4. Abdominal pain during pregnancy, second trimester     PLAN Discharge home in stable condition. Preterm labor precautions Bland diet for diarrhea Push fluids Keep f/u in office on Tuesday  Follow-up Information    [facility], [facility] Follow up.   Contact information: [address] [place] [address] [phone]          Allergies as of 07/13/2018      Reactions   Other Anaphylaxis   ALPHA-GAL (BEEF, PORK, LAMB, DEER, MUTTON).  REACTION ANAPHYLAXIS, HIVES, GI ISSUES.      Medication List    TAKE these medications   aspirin EC 81 MG tablet Take 81 mg by mouth daily.   benzonatate 100 MG capsule Commonly known as:  TESSALON Take 1 capsule (100 mg total) by mouth 3 (three) times daily as needed.   EPINEPHrine 0.3 mg/0.3 mL Soaj injection Commonly known as:  EPI-PEN Inject 0.3 mLs (0.3 mg total) into the muscle once.   FOLIC ACID PO Take 1 tablet by mouth daily.    HYDROcodone-acetaminophen 5-325 MG tablet Commonly known as:  NORCO/VICODIN 1-2 tablets every 4-6 hours as needed for pain   ibuprofen 600 MG tablet Commonly known as:  ADVIL,MOTRIN Take 1 tablet (600 mg total) by mouth every 6 (six) hours as needed.   predniSONE 10 MG (21) Tbpk tablet Commonly known as:  STERAPRED UNI-PAK 21 TAB Use as directed        Judeth Horn, NP 07/13/2018  9:53 PM

## 2018-07-13 NOTE — MAU Note (Signed)
Abdominal pain for the last hour, states it has gotten stronger, reports there is a constant burn and intermittent stabbing/spasm  No bleeding, no LOF

## 2018-07-15 DIAGNOSIS — Z362 Encounter for other antenatal screening follow-up: Secondary | ICD-10-CM | POA: Diagnosis not present

## 2018-08-09 DIAGNOSIS — Z3A24 24 weeks gestation of pregnancy: Secondary | ICD-10-CM | POA: Diagnosis not present

## 2018-08-09 DIAGNOSIS — O2612 Low weight gain in pregnancy, second trimester: Secondary | ICD-10-CM | POA: Diagnosis not present

## 2018-09-05 DIAGNOSIS — Z3689 Encounter for other specified antenatal screening: Secondary | ICD-10-CM | POA: Diagnosis not present

## 2018-09-05 DIAGNOSIS — Z23 Encounter for immunization: Secondary | ICD-10-CM | POA: Diagnosis not present

## 2018-09-05 LAB — OB RESULTS CONSOLE RPR: RPR: NONREACTIVE

## 2018-09-25 NOTE — L&D Delivery Note (Signed)
Operative Delivery Note Pushing over 2 hrs with maternal exhaustion and effort getting worse. Low vacuum d/w pt and agreed.   At 10:16 AM a viable and healthy female was delivered via Vaginal, Vacuum Investment banker, operational).  Presentation: vertex; Position: Occiput,, Anterior; Station: +3.  Verbal consent: obtained from patient.  Risks and benefits discussed in detail.  Risks include, but are not limited to the risks of anesthesia, bleeding, infection, damage to maternal tissues, fetal cephalhematoma.  There is also the risk of inability to effect vaginal delivery of the head, or shoulder dystocia that cannot be resolved by established maneuvers, leading to the need for emergency cesarean section.  APGAR: 8, 9; weight  .   Placenta status: spontaneous complete  Cord:  with the following complications: .  Cord pH: N/A  Anesthesia:  Epidural and local 1% lidocaine Instruments: Mushroom vacuum-- low  Episiotomy: Median Lacerations:  None other  Suture Repair: 3.0 vicryl rapide Est. Blood Loss (mL):  250  Mom to postpartum.  Baby to Couplet care / Skin to Skin.  Robley Fries 11/21/2018, 10:45 AM

## 2018-10-04 DIAGNOSIS — Z3A32 32 weeks gestation of pregnancy: Secondary | ICD-10-CM | POA: Diagnosis not present

## 2018-10-04 DIAGNOSIS — O0902 Supervision of pregnancy with history of infertility, second trimester: Secondary | ICD-10-CM | POA: Diagnosis not present

## 2018-10-16 DIAGNOSIS — Z3482 Encounter for supervision of other normal pregnancy, second trimester: Secondary | ICD-10-CM | POA: Diagnosis not present

## 2018-10-16 DIAGNOSIS — Z3483 Encounter for supervision of other normal pregnancy, third trimester: Secondary | ICD-10-CM | POA: Diagnosis not present

## 2018-10-29 DIAGNOSIS — Z3685 Encounter for antenatal screening for Streptococcus B: Secondary | ICD-10-CM | POA: Diagnosis not present

## 2018-10-29 LAB — OB RESULTS CONSOLE GBS: GBS: NEGATIVE

## 2018-11-20 ENCOUNTER — Inpatient Hospital Stay (HOSPITAL_COMMUNITY)
Admission: AD | Admit: 2018-11-20 | Discharge: 2018-11-24 | DRG: 807 | Disposition: A | Discharge status: Home / Self Care | Payer: 59 | Attending: Obstetrics & Gynecology | Admitting: Obstetrics & Gynecology

## 2018-11-20 ENCOUNTER — Encounter (HOSPITAL_COMMUNITY): Payer: Self-pay | Admitting: Obstetrics & Gynecology

## 2018-11-20 DIAGNOSIS — Z3A39 39 weeks gestation of pregnancy: Secondary | ICD-10-CM

## 2018-11-20 DIAGNOSIS — Z3490 Encounter for supervision of normal pregnancy, unspecified, unspecified trimester: Secondary | ICD-10-CM

## 2018-11-20 DIAGNOSIS — M5489 Other dorsalgia: Secondary | ICD-10-CM | POA: Diagnosis not present

## 2018-11-20 DIAGNOSIS — Z87891 Personal history of nicotine dependence: Secondary | ICD-10-CM

## 2018-11-20 DIAGNOSIS — N96 Recurrent pregnancy loss: Secondary | ICD-10-CM | POA: Diagnosis not present

## 2018-11-20 DIAGNOSIS — O26893 Other specified pregnancy related conditions, third trimester: Secondary | ICD-10-CM | POA: Diagnosis present

## 2018-11-20 HISTORY — DX: Recurrent pregnancy loss: N96

## 2018-11-20 HISTORY — DX: Encounter for supervision of normal pregnancy, unspecified, unspecified trimester: Z34.90

## 2018-11-20 LAB — CBC
HCT: 36.7 % (ref 36.0–46.0)
Hemoglobin: 12.8 g/dL (ref 12.0–15.0)
MCH: 31.8 pg (ref 26.0–34.0)
MCHC: 34.9 g/dL (ref 30.0–36.0)
MCV: 91.3 fL (ref 80.0–100.0)
Platelets: 230 10*3/uL (ref 150–400)
RBC: 4.02 MIL/uL (ref 3.87–5.11)
RDW: 12.8 % (ref 11.5–15.5)
WBC: 10.9 10*3/uL — ABNORMAL HIGH (ref 4.0–10.5)
nRBC: 0 % (ref 0.0–0.2)

## 2018-11-20 LAB — TYPE AND SCREEN
ABO/RH(D): A POS
Antibody Screen: NEGATIVE

## 2018-11-20 MED ORDER — OXYCODONE-ACETAMINOPHEN 5-325 MG PO TABS: 1.0000 | ORAL_TABLET | ORAL | Status: DC | PRN

## 2018-11-20 MED ORDER — ONDANSETRON HCL 4 MG/2ML IJ SOLN
4.0000 mg | Freq: Four times a day (QID) | INTRAMUSCULAR | Status: DC | PRN
  Administered 2018-11-21: 4 mg via INTRAVENOUS
  Filled 2018-11-20: qty 2

## 2018-11-20 MED ORDER — OXYCODONE-ACETAMINOPHEN 5-325 MG PO TABS: 2.0000 | ORAL_TABLET | ORAL | Status: DC | PRN

## 2018-11-20 MED ORDER — NALBUPHINE HCL 10 MG/ML IJ SOLN
10.0000 mg | INTRAMUSCULAR | Status: AC | PRN
  Administered 2018-11-21 (×2): 10 mg via INTRAVENOUS
  Filled 2018-11-20 (×2): qty 1

## 2018-11-20 MED ORDER — LACTATED RINGERS IV SOLN
500.0000 mL | INTRAVENOUS | Status: DC | PRN
  Administered 2018-11-21: 500 mL via INTRAVENOUS

## 2018-11-20 MED ORDER — LACTATED RINGERS IV SOLN
INTRAVENOUS | Status: DC
  Administered 2018-11-20 – 2018-11-21 (×3): via INTRAVENOUS

## 2018-11-20 MED ORDER — OXYTOCIN BOLUS FROM INFUSION
500.0000 mL | Freq: Once | INTRAVENOUS | Status: AC
  Administered 2018-11-21: 500 mL via INTRAVENOUS

## 2018-11-20 MED ORDER — SOD CITRATE-CITRIC ACID 500-334 MG/5ML PO SOLN: 30.0000 mL | ORAL | Status: DC | PRN

## 2018-11-20 MED ORDER — TERBUTALINE SULFATE 1 MG/ML IJ SOLN: 0.2500 mg | Freq: Once | INTRAMUSCULAR | Status: DC | PRN

## 2018-11-20 MED ORDER — OXYTOCIN 40 UNITS IN NORMAL SALINE INFUSION - SIMPLE MED
1.0000 m[IU]/min | INTRAVENOUS | Status: DC
  Administered 2018-11-20: 2 m[IU]/min via INTRAVENOUS
  Administered 2018-11-21: 8 m[IU]/min via INTRAVENOUS
  Filled 2018-11-20: qty 1000

## 2018-11-20 MED ORDER — LIDOCAINE HCL (PF) 1 % IJ SOLN
30.0000 mL | INTRAMUSCULAR | Status: AC | PRN
  Administered 2018-11-21: 30 mL via SUBCUTANEOUS
  Filled 2018-11-20: qty 30

## 2018-11-20 MED ORDER — ACETAMINOPHEN 325 MG PO TABS: 650.0000 mg | ORAL_TABLET | ORAL | Status: DC | PRN

## 2018-11-20 MED ORDER — OXYTOCIN 40 UNITS IN NORMAL SALINE INFUSION - SIMPLE MED
2.5000 [IU]/h | INTRAVENOUS | Status: DC
  Administered 2018-11-21: 2.5 [IU]/h via INTRAVENOUS

## 2018-11-20 NOTE — Progress Notes (Signed)
MONITORING ORDER  Per Dr. Juliene Pina, pt is allowed off the monitor to get into the shower for 30 min max then returned for continuous monitoring.

## 2018-11-20 NOTE — H&P (Addendum)
Latoya Reeves is a 28 y.o. female presenting for IOL at 39.1 weeks due to favorable cervix.  E3P2951, 5 1st trim miscarriages, this was successful pregnancy with HCG trigger after ovulation- saw Dr Elesa Hacker.  Since then, constipation, anal fissure, contractions/ false labor but overall uncomplicated pregnancy, AGA growth at 32 wks 4'10" 70% with nl AC but BPD 98%.  GBS(-).  Denies pain/ vag bleeding/ contractions/ LOF. Reports good FMs  OB History    Gravida  6   Para      Term      Preterm      AB  5   Living        SAB  5   TAB      Ectopic      Multiple      Live Births             Past Medical History:  Diagnosis Date  . Allergy to alpha-gal    alpha gal mammalian allergy---- beef , pork, lamb, mutton, deer  . Eczema   . Environmental and seasonal allergies   . History of multiple miscarriages 11/20/2018  . History of recurrent miscarriages   . Perennial allergic rhinitis   . Term pregnancy 11/20/2018   Past Surgical History:  Procedure Laterality Date  . HYSTEROSCOPY N/A 10/05/2017   Procedure: HYSTEROSCOPY, DILATION AND CURETTAGE;  Surgeon: Waynard Reeds, MD;  Location: [facility];  Service: Gynecology;  Laterality: N/A;  . LAPAROSCOPIC CHOLECYSTECTOMY  12/2012  . TYMPANOSTOMY TUBE PLACEMENT Bilateral child   Family History: family history includes Allergic rhinitis in her sister; Eczema in her father; Other in her sister. Social History:  reports that she quit smoking about 5 years ago. Her smoking use included cigarettes. She quit after 0.50 years of use. She has never used smokeless tobacco. She reports that she does not drink alcohol or use drugs.     Maternal Diabetes: No Genetic Screening: Normal Ultrascreen  Maternal Ultrasounds/Referrals: Normal  Isolated choroid plexus cyst, resolved at 32 wks sono  Fetal Ultrasounds or other Referrals:  None Maternal Substance Abuse:  No Significant Maternal Medications:  None Significant  Maternal Lab Results:  Lab values include: Group B Strep negative Other Comments:  None  ROS neg History   There were no vitals taken for this visit. Exam Physical Exam  Physical exam:  A&O x 3, no acute distress. Pleasant HEENT neg, no thyromegaly Lungs CTA bilat CV RRR, A1S2 normal Abdo soft, non tender, non acute Extr no edema/ tenderness Pelvic 1-2 cm/ -2/ Vx/ AROM clear  FHT 130s + accels no decels mod variab cat I Toco rare   Prenatal labs: ABO, Rh:  A + Antibody:  neg Rubella:  Imm RPR:   NR HBsAg:   Neg HIV:   Neg GBS:   Neg Glucola normal Flu and TDAP vaccines done   Assessment/Plan: 28 yo G6P0050 39 wks, prior SABs x5, here for IOL. Dated by 1st trim sono.  Pitocin per protocol, epidural as needed Anticipate SVD but watch for descent since BPD 98% at 32 wks.  EFW 7.1/2 lbs   Robley Fries 11/20/2018, 5:17 PM

## 2018-11-21 ENCOUNTER — Other Ambulatory Visit: Payer: Self-pay

## 2018-11-21 ENCOUNTER — Inpatient Hospital Stay (HOSPITAL_COMMUNITY): Payer: 59 | Admitting: Anesthesiology

## 2018-11-21 ENCOUNTER — Encounter (HOSPITAL_COMMUNITY): Payer: Self-pay

## 2018-11-21 LAB — RPR: RPR Ser Ql: NONREACTIVE

## 2018-11-21 LAB — ABO/RH: ABO/RH(D): A POS

## 2018-11-21 MED ORDER — PHENYLEPHRINE 40 MCG/ML (10ML) SYRINGE FOR IV PUSH (FOR BLOOD PRESSURE SUPPORT)
80.0000 ug | PREFILLED_SYRINGE | INTRAVENOUS | Status: DC | PRN
  Filled 2018-11-21: qty 10

## 2018-11-21 MED ORDER — METHYLERGONOVINE MALEATE 0.2 MG/ML IJ SOLN
INTRAMUSCULAR | Status: AC
  Filled 2018-11-21: qty 1

## 2018-11-21 MED ORDER — ACETAMINOPHEN 325 MG PO TABS
650.0000 mg | ORAL_TABLET | ORAL | Status: DC | PRN
  Administered 2018-11-21 – 2018-11-23 (×9): 650 mg via ORAL
  Filled 2018-11-21 (×9): qty 2

## 2018-11-21 MED ORDER — SENNOSIDES-DOCUSATE SODIUM 8.6-50 MG PO TABS
2.0000 | ORAL_TABLET | ORAL | Status: DC
  Administered 2018-11-21 – 2018-11-22 (×2): 2 via ORAL
  Filled 2018-11-21 (×2): qty 2

## 2018-11-21 MED ORDER — DIPHENHYDRAMINE HCL 25 MG PO CAPS: 25.0000 mg | ORAL_CAPSULE | Freq: Four times a day (QID) | ORAL | Status: DC | PRN

## 2018-11-21 MED ORDER — FENTANYL-BUPIVACAINE-NACL 0.5-0.125-0.9 MG/250ML-% EP SOLN
12.0000 mL/h | EPIDURAL | Status: DC | PRN
  Filled 2018-11-21: qty 250

## 2018-11-21 MED ORDER — PRENATAL MULTIVITAMIN CH
1.0000 | ORAL_TABLET | Freq: Every day | ORAL | Status: DC
  Administered 2018-11-22 – 2018-11-23 (×2): 1 via ORAL
  Filled 2018-11-21 (×2): qty 1

## 2018-11-21 MED ORDER — ONDANSETRON HCL 4 MG PO TABS: 4.0000 mg | ORAL_TABLET | ORAL | Status: DC | PRN

## 2018-11-21 MED ORDER — SIMETHICONE 80 MG PO CHEW: 80.0000 mg | CHEWABLE_TABLET | ORAL | Status: DC | PRN

## 2018-11-21 MED ORDER — EPHEDRINE 5 MG/ML INJ: 10.0000 mg | INTRAVENOUS | Status: DC | PRN

## 2018-11-21 MED ORDER — ONDANSETRON HCL 4 MG/2ML IJ SOLN: 4.0000 mg | INTRAMUSCULAR | Status: DC | PRN

## 2018-11-21 MED ORDER — LIDOCAINE-EPINEPHRINE (PF) 2 %-1:200000 IJ SOLN
INTRAMUSCULAR | Status: DC | PRN
  Administered 2018-11-21 (×2): 5 mL via EPIDURAL

## 2018-11-21 MED ORDER — IBUPROFEN 600 MG PO TABS
600.0000 mg | ORAL_TABLET | Freq: Four times a day (QID) | ORAL | Status: DC
  Administered 2018-11-21 – 2018-11-23 (×9): 600 mg via ORAL
  Filled 2018-11-21 (×9): qty 1

## 2018-11-21 MED ORDER — SODIUM CHLORIDE (PF) 0.9 % IJ SOLN
INTRAMUSCULAR | Status: DC | PRN
  Administered 2018-11-21: 12 mL/h via EPIDURAL

## 2018-11-21 MED ORDER — ZOLPIDEM TARTRATE 5 MG PO TABS: 5.0000 mg | ORAL_TABLET | Freq: Every evening | ORAL | Status: DC | PRN

## 2018-11-21 MED ORDER — COCONUT OIL OIL
1.0000 "application " | TOPICAL_OIL | Status: DC | PRN
  Filled 2018-11-21: qty 120

## 2018-11-21 MED ORDER — BENZOCAINE-MENTHOL 20-0.5 % EX AERO
1.0000 "application " | INHALATION_SPRAY | CUTANEOUS | Status: DC | PRN
  Administered 2018-11-21: 1 via TOPICAL
  Filled 2018-11-21: qty 56

## 2018-11-21 MED ORDER — WITCH HAZEL-GLYCERIN EX PADS: 1.0000 "application " | MEDICATED_PAD | CUTANEOUS | Status: DC | PRN

## 2018-11-21 MED ORDER — TETANUS-DIPHTH-ACELL PERTUSSIS 5-2.5-18.5 LF-MCG/0.5 IM SUSP: 0.5000 mL | Freq: Once | INTRAMUSCULAR | Status: DC

## 2018-11-21 MED ORDER — DIBUCAINE 1 % RE OINT
1.0000 "application " | TOPICAL_OINTMENT | RECTAL | Status: DC | PRN
  Filled 2018-11-21: qty 28

## 2018-11-21 MED ORDER — DIPHENHYDRAMINE HCL 50 MG/ML IJ SOLN: 12.5000 mg | INTRAMUSCULAR | Status: DC | PRN

## 2018-11-21 MED ORDER — PHENYLEPHRINE 40 MCG/ML (10ML) SYRINGE FOR IV PUSH (FOR BLOOD PRESSURE SUPPORT): 80.0000 ug | PREFILLED_SYRINGE | INTRAVENOUS | Status: DC | PRN

## 2018-11-21 MED ORDER — LIDOCAINE HCL (PF) 1 % IJ SOLN
INTRAMUSCULAR | Status: DC | PRN
  Administered 2018-11-21 (×2): 5 mL via EPIDURAL

## 2018-11-21 MED ORDER — LACTATED RINGERS IV SOLN: 500.0000 mL | Freq: Once | INTRAVENOUS | Status: DC

## 2018-11-21 NOTE — Progress Notes (Signed)
QUINNESHA GEDDINGS is a 28 y.o. G6P0050 at 105w2d by ultrasound admitted for induction of labor due to Elective at term.  Subjective: Epidural working on one side only, RN getting anesthesia to reassess  2 doses of Nubain before this   Objective: BP (!) 126/51   Pulse 78   Temp 97.9 F (36.6 C) (Oral)   Resp 17   Ht 5\' 6"  (1.676 m)   Wt 88.5 kg   LMP  (LMP Unknown) Comment: per pt unsure, has had 3 miscarriage's in past 5 months  SpO2 96%   BMI 31.47 kg/m   FHT:  FHR: 120s bpm, variability: moderate,  accelerations:  Present,  decelerations:  Absent UC:   regular, every 3-4 minutes SVE:   Dilation: 4.5 Effacement (%): 80 Station: -1 Exam by:: B. Price, rn  Labs: Lab Results  Component Value Date   WBC 10.9 (H) 11/20/2018   HGB 12.8 11/20/2018   HCT 36.7 11/20/2018   MCV 91.3 11/20/2018   PLT 230 11/20/2018    Assessment / Plan: IOL, now entering active labor    Fetal Wellbeing:  Category I Pain Control:  Epidural I/D:  n/a Anticipated MOD:  NSVD  Robley Fries 11/21/2018, 6:41 AM

## 2018-11-21 NOTE — Anesthesia Preprocedure Evaluation (Addendum)
Anesthesia Evaluation  Patient identified by MRN, date of birth, ID band Patient awake    Reviewed: Allergy & Precautions, NPO status , Patient's Chart, lab work & pertinent test results  History of Anesthesia Complications Negative for: history of anesthetic complications  Airway Mallampati: III  TM Distance: >3 FB Neck ROM: Full    Dental  (+) Dental Advisory Given   Pulmonary former smoker (quit 2014),    breath sounds clear to auscultation       Cardiovascular negative cardio ROS   Rhythm:Regular Rate:Normal     Neuro/Psych negative neurological ROS     GI/Hepatic negative GI ROS, Neg liver ROS,   Endo/Other  negative endocrine ROS  Renal/GU negative Renal ROS     Musculoskeletal   Abdominal   Peds  Hematology plt 230k   Anesthesia Other Findings   Reproductive/Obstetrics (+) Pregnancy                            Anesthesia Physical Anesthesia Plan  ASA: II  Anesthesia Plan: Epidural   Post-op Pain Management:    Induction:   PONV Risk Score and Plan: 1 and Treatment may vary due to age or medical condition  Airway Management Planned: Natural Airway  Additional Equipment:   Intra-op Plan:   Post-operative Plan:   Informed Consent: I have reviewed the patients History and Physical, chart, labs and discussed the procedure including the risks, benefits and alternatives for the proposed anesthesia with the patient or authorized representative who has indicated his/her understanding and acceptance.     Dental advisory given  Plan Discussed with:   Anesthesia Plan Comments: (Patient identified. Risks/Benefits/Options discussed with patient including but not limited to bleeding, infection, nerve damage, paralysis, failed block, incomplete pain control, headache, blood pressure changes, nausea, vomiting, reactions to medication both or allergic, itching and postpartum back  pain. Confirmed with bedside nurse the patient's most recent platelet count. Confirmed with patient that they are not currently taking any anticoagulation, have any bleeding history or any family history of bleeding disorders. Patient expressed understanding and wished to proceed. All questions were answered. )       Anesthesia Quick Evaluation

## 2018-11-21 NOTE — Progress Notes (Signed)
Called by Dr. Juliene Pina to be on standby for delivery.  S:  More comfortable with epidural, but still feeling pressure with contractions and some sharp pain on right side.  O: Pitocin off.   VS: Blood pressure 125/88, pulse 78, temperature 98.6 F (37 C), temperature source Oral, resp. rate 18, height 5\' 6"  (1.676 m), weight 88.5 kg, SpO2 99 %.        FHR : baseline 125 bpm / variability moderate / accelerations + / variable decelerations with pushing to nadir of 90 bpm        Toco: contractions every 2-4 minutes / strong         Cervix : Dilation: 10 Dilation Complete Date: 11/21/18 Dilation Complete Time: 0750 Effacement (%): 80 Cervical Position: Posterior Station: Plus 2 Presentation: Vertex Exam by:: M. Kei Mcelhiney CNM         Membranes: clear fluid  A: Second stage labor     FHR category 2     GBS Negative   P: Begin Pushing     Anticipate NSVD    Carlean Jews, MSN, CNM [facility]

## 2018-11-21 NOTE — Progress Notes (Signed)
Per Mody on phone can decrease pit in order to make pt more comfortable until epidural redose is effective.

## 2018-11-21 NOTE — Progress Notes (Signed)
Patient ID: Latoya Reeves, female   DOB: 10-12-1990, 28 y.o.   MRN: [id number] Pt progressed rapidly to 9.5 cm per RN I am assisting a surgery and patient aware that my CNM will stand in for delivery. CNM Sigmon aware and agrees.  FHT cat I

## 2018-11-21 NOTE — Anesthesia Procedure Notes (Signed)
Epidural Patient location during procedure: OB Start time: 11/21/2018 5:48 AM End time: 11/21/2018 6:09 AM  Staffing Anesthesiologist: Jairo Ben, MD Performed: anesthesiologist   Preanesthetic Checklist Completed: patient identified, surgical consent, pre-op evaluation, timeout performed, IV checked, risks and benefits discussed and monitors and equipment checked  Epidural Patient position: sitting Prep: site prepped and draped and DuraPrep Patient monitoring: blood pressure, continuous pulse ox and heart rate Approach: midline Location: L3-L4 Injection technique: LOR air  Needle:  Needle type: Tuohy  Needle gauge: 17 G Needle length: 9 cm Needle insertion depth: 6 cm Catheter type: closed end flexible Catheter size: 19 Gauge Catheter at skin depth: 11 cm Test dose: negative (1% lidocaine)  Assessment Events: blood not aspirated, injection not painful, no injection resistance, negative IV test and no paresthesia  Additional Notes Pt identified in Labor room.  Monitors applied. Working IV access confirmed. Sterile prep, drape lumbar spine.  1% lido local L 3,4.  #17ga Touhy LOR air at 6 cm L 3,4, cath in easily to 11 cm skin. Test dose OK, cath dosed and infusion begun.  Patient asymptomatic, VSS, no heme aspirated, tolerated well.  Sandford Craze, MDReason for block:procedure for pain

## 2018-11-22 LAB — CBC
HEMATOCRIT: 34.4 % — AB (ref 36.0–46.0)
Hemoglobin: 11.6 g/dL — ABNORMAL LOW (ref 12.0–15.0)
MCH: 31.4 pg (ref 26.0–34.0)
MCHC: 33.7 g/dL (ref 30.0–36.0)
MCV: 93 fL (ref 80.0–100.0)
Platelets: 199 10*3/uL (ref 150–400)
RBC: 3.7 MIL/uL — ABNORMAL LOW (ref 3.87–5.11)
RDW: 13.1 % (ref 11.5–15.5)
WBC: 12.6 10*3/uL — ABNORMAL HIGH (ref 4.0–10.5)
nRBC: 0 % (ref 0.0–0.2)

## 2018-11-22 MED ORDER — HYDROCORTISONE ACE-PRAMOXINE 1-1 % RE FOAM
1.0000 | Freq: Two times a day (BID) | RECTAL | Status: DC
  Administered 2018-11-22 – 2018-11-23 (×3): 1 via RECTAL
  Filled 2018-11-22 (×3): qty 10

## 2018-11-22 NOTE — Progress Notes (Signed)
PPD 1 VAVD with median episiotomy repair  S:  Reports feeling ok - soreness and rectal pressure/ no vaginal pressure or fullness - only rectal             Tolerating po/ No nausea or vomiting             Bleeding is light             Pain controlled /   Motrin and Tylenol concurrently works best             Up ad lib / ambulatory / voiding QS / no BM yet  Newborn Breast   O:      VS: BP (!) 103/56 (BP Location: Left Arm)   Pulse (!) 47   Temp 98.2 F (36.8 C) (Oral)   Resp 16   Ht 5\' 6"  (1.676 m)   Wt 88.5 kg   LMP  (LMP Unknown) Comment: per pt unsure, has had 3 miscarriage's in past 5 months  SpO2 98%   Breastfeeding Unknown   BMI 31.47 kg/m    LABS:             Recent Labs    11/20/18 1737 11/22/18 0610  WBC 10.9* 12.6*  HGB 12.8 11.6*  PLT 230 199               Blood type: --/--/A POS, A POS Performed at [facility], [address], [place], [place] [address]  [phone]/26 [phone])  Rubella: Immune (08/09 0000)                       Physical Exam:             Alert and oriented X3  Abdomen: soft, non-tender, non-distended              Fundus: firm, non-tender, Ueven  Perineum: ice pack in place  Lochia: moderate  Extremities: no edema, no calf pain or tenderness   A: PPD # 1 VAVD with median episiotomy repair   doing well - stable status  P: Routine post partum orders  discussed possible internal hemorrhoids with rectal pressure and heaviness from pushing - trial proctofoam for pain             Anticipate DC tomorrow  Marlinda Mike CNM, MSN, [facility] 11/22/2018, 9:04 AM

## 2018-11-22 NOTE — Lactation Note (Addendum)
This note was copied from a baby's chart. Lactation Consultation Note  Patient Name: Latoya Reeves [id number] Date: 11/22/2018 Reason for consult: Follow-up assessment;Primapara;1st time breastfeeding;Term;Infant weight loss;Difficult latch  29 hours old FT female who is being exclusively BF by her mother, she's a P1. RN Clinton Gallant has been very proactive and already set mom up with a DEBP pump, she was having a hard time latching baby to the breast, baby hasn't had a stool yet. Mom already pumping when entering the room, offered assistance with latch and parents agreed to try again, even though they just tried less than an hour ago.  Mom got about 3 ml of colostrum through double pumping, LC showed parents how to spoon feed baby and she took the entire amount. Mom also voiced that she has been pumping since the last week of her pregnancy and has about 4 tablespoons of colostrum at home. Urged mom to bring it to the hospital in order to supplement baby, dad will go home today and bring it in.   [facility] Ms took baby STS to mom's right breast per her request but she wasn't able to latch in cross cradle position, however when we tried football she did for a few minutes, mom was really excited but also voiced baby had a really strong suck. LC agreed and let mom know she was doing that too during spoon feeding. Baby required constant repositioning and fed for 5 minutes on the right breast and when mom tried the left one (also in football) she fell asleep shortly after. Mom was leaking colostrum when hand expression but no audible swallows noted when baby was nursing, it was hard to hear due to baby being congested. Baby fed for another minute on the left breast before falling asleep.  Parent were very engaged during [facility] consultation and had lots of questions. Reviewed feeding plan, cluster feeding, normal newborn behavior and supplementation guidelines. LC also showed them how to burp baby. Once baby was done feeding mom  kept her STS.   Feeding plan:  1. Encouraged mom to feed baby STS 8-12 times/24 hours or sooner if feeding cues are present 2. Parents will continue supplementing baby with mom's EBM according to formula supplementation guidelines 3. Mom will continue pumping every 3 hours and at least once at night and will use coconut oil prior pumping and her own colostrum for breast care  Parents reported all questions and concerns were answered, they're both aware of LC services and will call PRN.  Maternal Data    Feeding Feeding Type: Breast Fed  LATCH Score Latch: Repeated attempts needed to sustain latch, nipple held in mouth throughout feeding, stimulation needed to elicit sucking reflex.  Audible Swallowing: None(hard to hear, baby was very congested)  Type of Nipple: Everted at rest and after stimulation  Comfort (Breast/Nipple): Soft / non-tender  Hold (Positioning): Assistance needed to correctly position infant at breast and maintain latch.  LATCH Score: 6  Interventions Interventions: Breast feeding basics reviewed;Assisted with latch;Skin to skin;Breast massage;Hand express;Breast compression;Adjust position;Support pillows;Position options;Expressed milk;Coconut oil  Lactation Tools Discussed/Used Tools: Coconut oil;Pump Breast pump type: Double-Electric Breast Pump Pump Review: Setup, frequency, and cleaning;Milk Storage Initiated by:: Delrae Sawyers RN Date initiated:: 11/22/18   Consult Status Consult Status: Follow-up Date: 11/23/18 Follow-up type: In-patient    Deon Duer Venetia Constable 11/22/2018, 3:26 PM

## 2018-11-22 NOTE — Lactation Note (Signed)
This note was copied from a baby's chart. Lactation Consultation Note Baby has been cueing but will not latch to breast. Baby is spitting up mucous. Encouraged mom to hold baby upright w/family alternating holding or watching baby. Mom has coconut oil on nipples.  Encouraged mom to hold baby STS if baby fussy and will not latch. Mom very sleepy. Will f/u at later time when mom and baby ready.  Patient Name: Latoya Reeves [id number] Date: 11/22/2018     Maternal Data    Feeding    LATCH Score                   Interventions    Lactation Tools Discussed/Used     Consult Status      Charyl Dancer 11/22/2018, 1:58 AM

## 2018-11-23 MED ORDER — IBUPROFEN 600 MG PO TABS: 600.0000 mg | ORAL_TABLET | Freq: Four times a day (QID) | ORAL | 0 refills | Status: DC

## 2018-11-23 NOTE — Progress Notes (Signed)
PPD 2 VAVD with median episiotomy repair  S:  Reports feeling good but baby to remain inpatient             Tolerating po/ No nausea or vomiting             Bleeding is light             Pain controlled with motrin             Up ad lib / ambulatory / voiding QS  Newborn Breast / initiated on phototherapy with bili level at 12  O:      VS: BP 116/75 (BP Location: Right Arm)   Pulse 63   Temp 97.8 F (36.6 C) (Oral)   Resp 18   Ht 5\' 6"  (1.676 m)   Wt 88.5 kg   LMP  (LMP Unknown) Comment: per pt unsure, has had 3 miscarriage's in past 5 months  SpO2 100%   Breastfeeding Unknown   BMI 31.47 kg/m               Physical Exam:             Alert and oriented X3  Abdomen: soft, non-tender, non-distended              Fundus: firm, non-tender, Ueven  Lochia: light  Extremities: trace edema, no calf pain or tenderness  A: PPD # 2 VAVD with median episiotomy repair   Doing well - stable status  P: Routine post partum orders  DC but will be rooming-in with newborn on photoptherapy             WOB booklet - instructions reviewed  Marlinda Mike CNM, MSN, [facility] 11/23/2018, 10:32 AM

## 2018-11-23 NOTE — Discharge Summary (Signed)
Obstetric Discharge Summary Reason for Admission: induction of labor - elective Prenatal Procedures: ultrasound - AGA growth Intrapartum Procedures: spontaneous vaginal delivery, vacuum and episiotomy median Postpartum Procedures: none Complications-Operative and Postpartum: none Hemoglobin  Date Value Ref Range Status  11/22/2018 11.6 (L) 12.0 - 15.0 g/dL Final   HCT  Date Value Ref Range Status  11/22/2018 34.4 (L) 36.0 - 46.0 % Final    Physical Exam:  General: alert, cooperative and no distress Lochia: appropriate Uterine Fundus: firm Incision: healing well DVT Evaluation: No evidence of DVT seen on physical exam.  Discharge Diagnoses: Term Pregnancy-delivered with median episiotomy & repair  Discharge Information: Date: 11/23/2018 Activity: pelvic rest Diet: routine Medications: PNV and Ibuprofen Condition: stable Instructions: refer to practice specific booklet Discharge to: home Follow-up Information    Latoya Evans, MD. Schedule an appointment as soon as possible for a visit in 6 week(s).   Specialty:  Obstetrics and Gynecology Contact information: [address] [place] [address] [phone]          Newborn Data: Live born female  Birth Weight: 6 lb 14.6 oz (3135 g) APGAR: 8, 9  Newborn Delivery   Birth date/time:  11/21/2018 10:16:00 Delivery type:  Vaginal, Vacuum (Extractor)    Initiated on phototherapy for bilirubin level of 12  - remaining as inpatient / mother to room-in to breastfeed  Marlinda Mike 11/23/2018, 11:19 AM

## 2018-11-23 NOTE — Anesthesia Postprocedure Evaluation (Signed)
Anesthesia Post Note  Patient: Latoya Reeves  Procedure(s) Performed: AN AD HOC LABOR EPIDURAL     Patient location during evaluation: Mother Baby Anesthesia Type: Epidural Level of consciousness: awake and alert Pain management: pain level controlled Vital Signs Assessment: post-procedure vital signs reviewed and stable Respiratory status: spontaneous breathing, nonlabored ventilation and respiratory function stable Cardiovascular status: stable Postop Assessment: no headache, no backache and epidural receding Anesthetic complications: no Comments: Patient stated she had "hot spot" on right side and was uncomfortable.    Last Vitals:  Vitals:   11/22/18 2118 11/23/18 0514  BP: (!) 94/58 116/75  Pulse: (!) 49 (!) 42  Resp: 16 18  Temp: 36.6 C 36.6 C  SpO2: 98% 100%    Last Pain:  Vitals:   11/23/18 0514  TempSrc: Oral  PainSc:    Pain Goal:                   [name]

## 2018-11-23 NOTE — Lactation Note (Signed)
This note was copied from a baby's chart. Lactation Consultation Note  Patient Name: Latoya Reeves [id number] Date: 11/23/2018 Reason for consult: Follow-up assessment;Term;1st time breastfeeding;Primapara  P1 mother whose infant is now 68 hours old.    Baby was sleeping in visitor's arms when I arrived.  She had fed approximately one hour ago.  Mother was getting ready to pump with the DEBP.  Per mother, baby has not been breast feeding well and mother has pain every time baby breast feeds.  Mother's breasts are filling and right nipple/areola is swollen.  Nipples are pinched from an incorrect latch.  Mother desires to exclusively breast feed.  At the last pumping session mother was able to obtain 10 mls of EBM from each breast.  I suggested mother call her RN/LC for the next feeding so we can assess the latch and the feeding to be sure baby is transferring breast milk.  Baby is jaundiced but had a delay in beginning to have bowel movements also.  She is now having more bowel movements.  RN updated and NP aware.  Will wait for mother to call for assistance and re-evaluate feeding.     Maternal Data Formula Feeding for Exclusion: No Has patient been taught Hand Expression?: Yes Does the patient have breastfeeding experience prior to this delivery?: No  Feeding Feeding Type: Breast Fed  LATCH Score                   Interventions    Lactation Tools Discussed/Used Tools: Coconut oil Breast pump type: Double-Electric Breast Pump Initiated by:: Already initiated   Consult Status Consult Status: Follow-up Date: 11/24/18 Follow-up type: In-patient    Carrell Palmatier R Nayelie Gionfriddo 11/23/2018, 10:27 AM

## 2018-11-24 ENCOUNTER — Ambulatory Visit: Payer: Self-pay

## 2018-11-24 NOTE — Lactation Note (Addendum)
This note was copied from a baby's chart. Lactation Consultation Note  Patient Name: Latoya Reeves [id number] Date: 11/24/2018 Reason for consult: Follow-up assessment;Term;Primapara;1st time breastfeeding;Engorgement  P1 mother whose infant is now 14 hours old.  RN requested LC assistance for engorgement.  Mother teary and in pain when I arrived.  Her breasts are firm and engorged.  Due to her pain level she introduced a bottle nipple to baby last night with EBM but is feeling sad about having to do this.  Emotional support provided and a plan put into place with parents.  Family should be discharged today.  Observed mother's breasts to be firm and hard.  Mother's nipples are short shafted, but, with the manual pump provided yesterday her nipples evert nicely.  They are intact but sore.  Suggested mother use her manual pump to help soften nipple/areola.  She expressed 13 mls of EBM which we saved for supplementing.    Baby observed to have a short lingual frenulum.  Mentioned this to the parents and neither parent has this issue.  Baby was able to suck effectively on my gloved finger and she was able to extend tongue when relaxed.  However, tongue extension and movement was minimal.  Suggested parents talk with pediatrician this morning during rounds.  Positioned mother comfortably and assisted baby to latch onto the left breast.  Baby was unwilling to suck and became very fussy and agitated.  Mother complained of pain with latching.  Suggested a nipple shield and mother agreed.  #24 NS with instructions for placement provided.  Assisted baby to latch again and, this time, mother felt no pain.  Baby began immediately sucking and rapidly started gulping EBM.  LC removed baby once at the breast when she began sucking so strong to allow her to take a break.  Baby placed back at the breast and fed for 15 minutes without difficulty. Large amounts of milk in NS upon completion.  Mother stated she felt much  more relief and comfortable. Demonstrated effective burping and placed baby at the right breast in the same hold.  She latched easily but did not want to suck.    Offered to demonstrate an alternate feeding method for supplementation and mother agreeable.  Demonstrated the foley cup and baby took an additional 13 mls with ease.  Burped well and swaddled.  Baby given to grandmother to hold.  Initiated the DEBP with mother and she pumped for 15 minutes, feeling more relieved and grateful for the help.  Also discussed RPS and filled ice bags for mother to place around breasts for the next 15-20 minutes to relieve engorgement.  Discussed how coconut oil and massage can aid in obtaining relief.    Feeding plan for home established and answered all parents' questions.  Discussed using cabbage leaves for severe engorgement.  Informed mother of OP lactation services and mother will probably return for a visit.  Brochure provided and discussed how to place the phone call if she decides this would be a good option for her.  She has our phone number for any further questions/concerns after discharge.  Family present and supportive.  RN updated.   Maternal Data Formula Feeding for Exclusion: No Has patient been taught Hand Expression?: Yes Does the patient have breastfeeding experience prior to this delivery?: No  Feeding Feeding Type: Breast Fed  LATCH Score Latch: Grasps breast easily, tongue down, lips flanged, rhythmical sucking.  Audible Swallowing: Spontaneous and intermittent  Type of Nipple: Everted at  rest and after stimulation(short shafted initially; nipples protrudes using manual pump)  Comfort (Breast/Nipple): Filling, red/small blisters or bruises, mild/mod discomfort  Hold (Positioning): Assistance needed to correctly position infant at breast and maintain latch.  LATCH Score: 8  Interventions Interventions: Breast feeding basics reviewed;Assisted with latch;Skin to skin;Breast  massage;Hand express;Pre-pump if needed;Reverse pressure;Breast compression;Hand pump;Coconut oil;Expressed milk;Adjust position;DEBP;Ice  Lactation Tools Discussed/Used Tools: Pump;Flanges;Coconut oil;Nipple Shields Nipple shield size: 24 Flange Size: 27 Breast pump type: Double-Electric Breast Pump;Manual WIC Program: No Pump Review: Setup, frequency, and cleaning;Milk Storage Initiated by:: Reviewed Date initiated:: 11/24/18   Consult Status Consult Status: Complete Date: 11/24/18 Follow-up type: Call as needed    Latoya Reeves 11/24/2018, 10:13 AM

## 2018-12-02 DIAGNOSIS — R300 Dysuria: Secondary | ICD-10-CM | POA: Diagnosis not present

## 2018-12-02 DIAGNOSIS — R102 Pelvic and perineal pain: Secondary | ICD-10-CM | POA: Diagnosis not present

## 2018-12-31 DIAGNOSIS — Z3483 Encounter for supervision of other normal pregnancy, third trimester: Secondary | ICD-10-CM | POA: Diagnosis not present

## 2018-12-31 DIAGNOSIS — Z3482 Encounter for supervision of other normal pregnancy, second trimester: Secondary | ICD-10-CM | POA: Diagnosis not present

## 2019-01-10 DIAGNOSIS — L259 Unspecified contact dermatitis, unspecified cause: Secondary | ICD-10-CM | POA: Diagnosis not present

## 2019-01-10 DIAGNOSIS — Z6827 Body mass index (BMI) 27.0-27.9, adult: Secondary | ICD-10-CM | POA: Diagnosis not present

## 2019-02-05 DIAGNOSIS — Z3482 Encounter for supervision of other normal pregnancy, second trimester: Secondary | ICD-10-CM | POA: Diagnosis not present

## 2019-02-05 DIAGNOSIS — Z3483 Encounter for supervision of other normal pregnancy, third trimester: Secondary | ICD-10-CM | POA: Diagnosis not present

## 2019-02-20 DIAGNOSIS — O9279 Other disorders of lactation: Secondary | ICD-10-CM | POA: Diagnosis not present

## 2019-02-20 DIAGNOSIS — N941 Unspecified dyspareunia: Secondary | ICD-10-CM | POA: Diagnosis not present

## 2019-03-07 DIAGNOSIS — Z3482 Encounter for supervision of other normal pregnancy, second trimester: Secondary | ICD-10-CM | POA: Diagnosis not present

## 2019-03-07 DIAGNOSIS — Z3483 Encounter for supervision of other normal pregnancy, third trimester: Secondary | ICD-10-CM | POA: Diagnosis not present

## 2019-03-08 IMAGING — US US MFM OB TRANSVAGINAL
1 series · 15 of 28 positions shown · non-contrast
Comparison: none

[Series 1: us mfm ob transvaginal · 51 acquisitions, 15 frames shown]
[im 1/51]
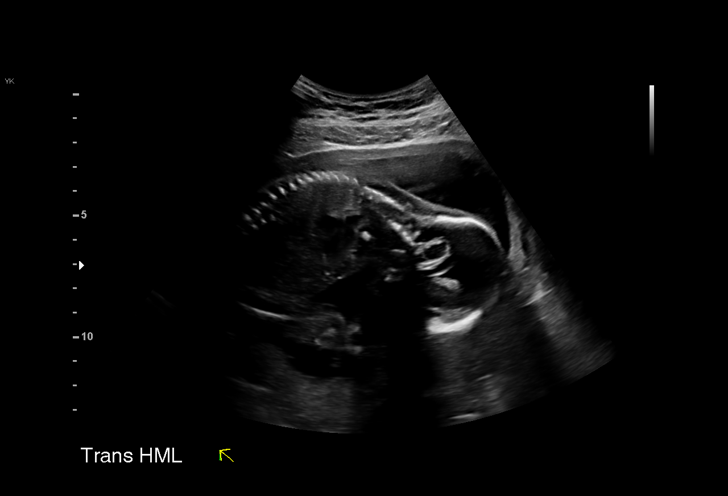
[im 4/51]
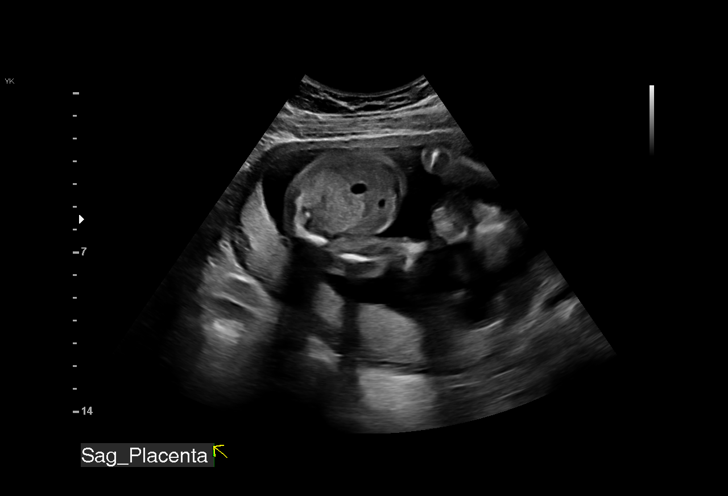
[im 8/51]
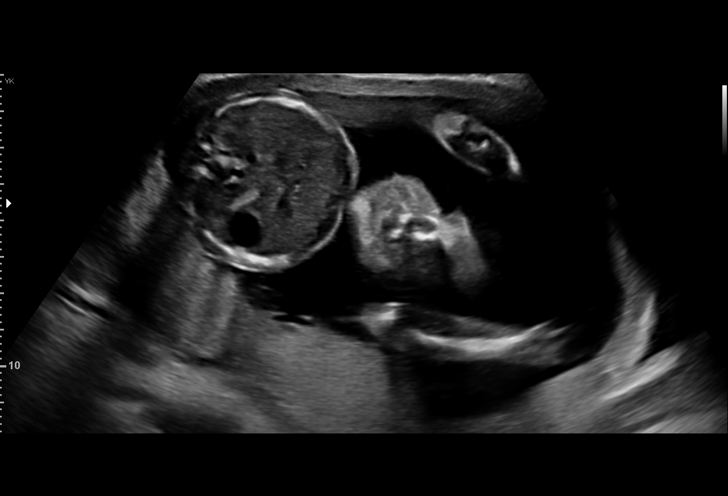
[im 12/51]
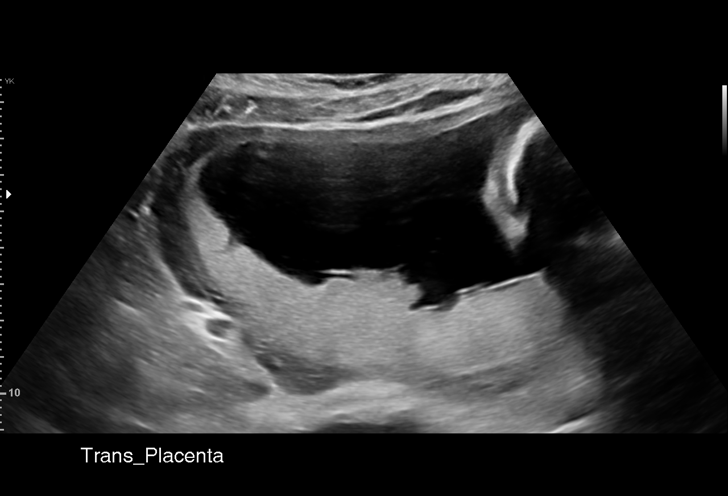
[im 15/51]
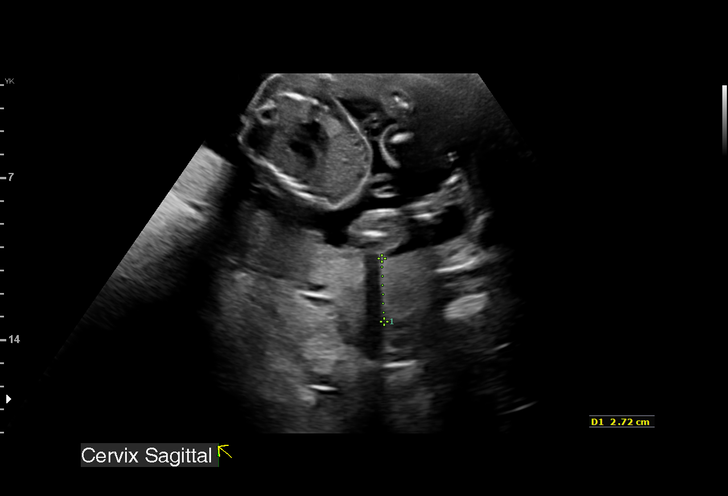
[im 19/51]
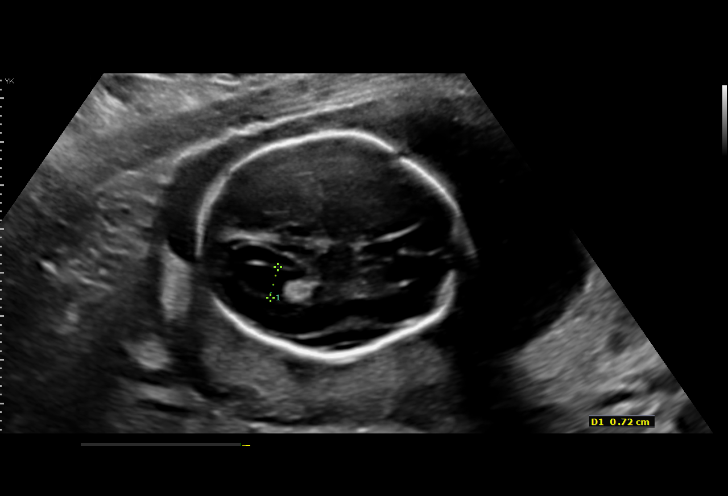
[im 23/51]
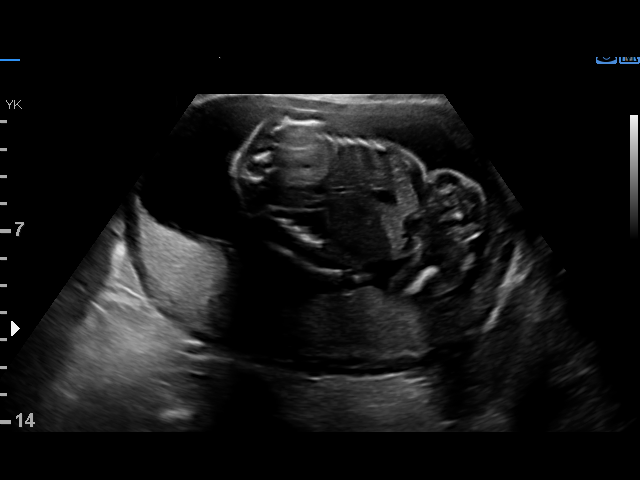
[im 26/51]
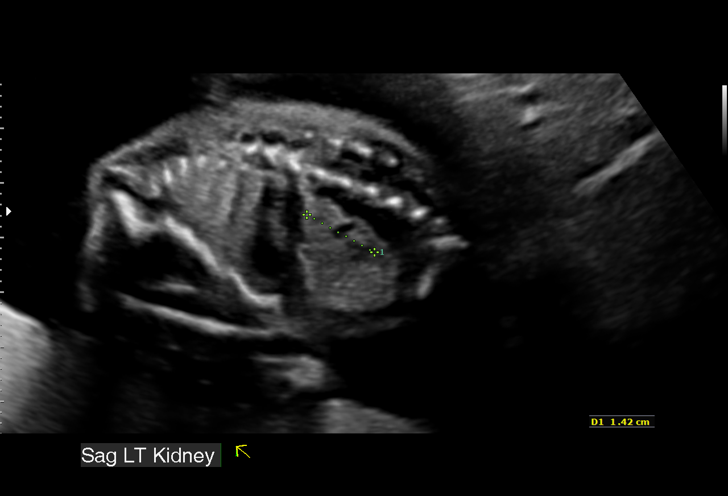
[im 28/51]
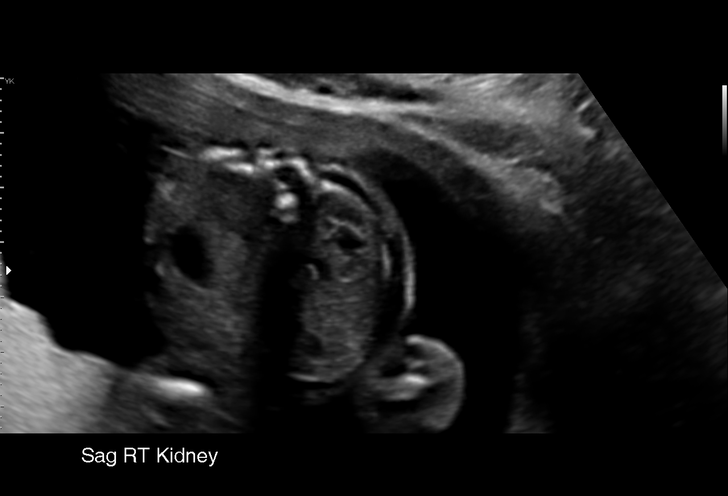
[im 32/51]
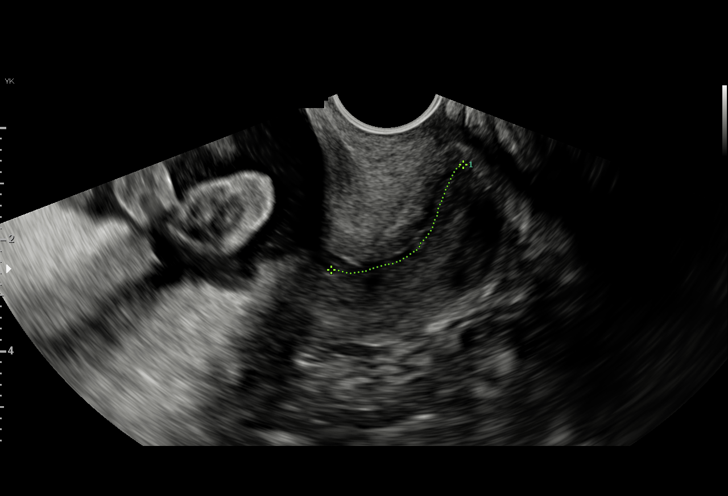
[im 36/51]
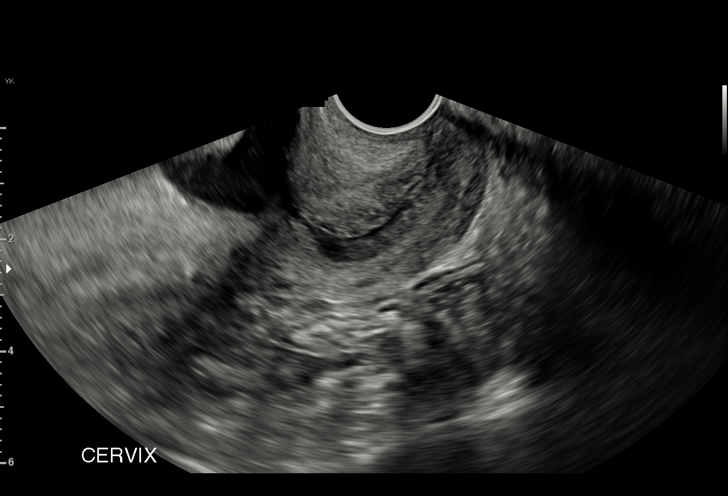
[im 39/51]
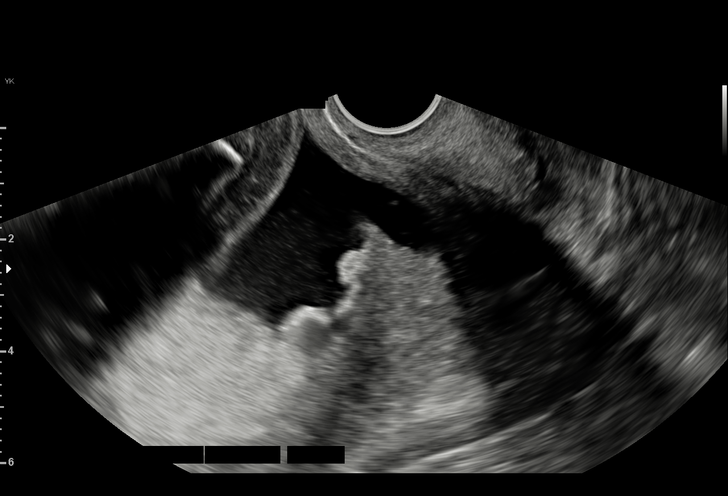
[im 43/51]
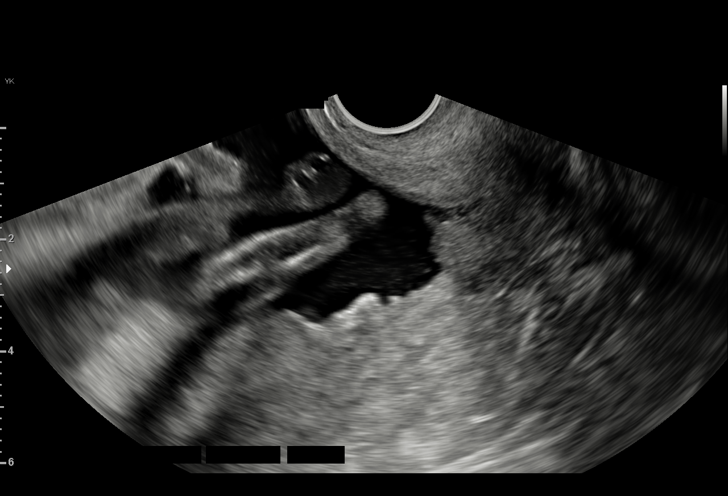
[im 47/51]
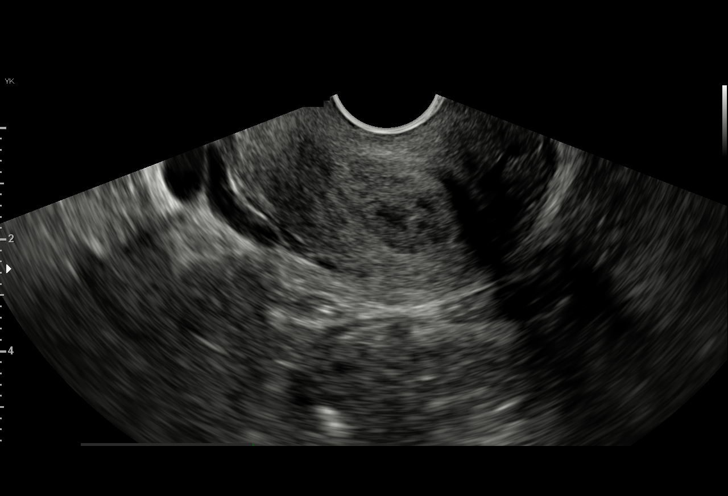
[im 51/51]
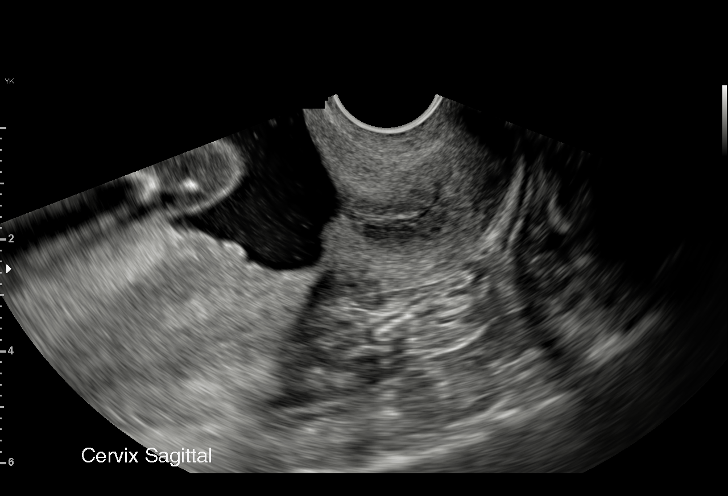

[15 of 28 positions shown; findings below may reference images not displayed]

Attending:        Fakhmy Samchu      Secondary Phy.:   [facility]-
MAU/Triage

Indications

20 weeks gestation of pregnancy
Abdominal pain in pregnancy
Poor obstetric history-Recurrent (habitual)
abortion (3 consecutive ab's)

Fetal Evaluation

Num Of Fetuses:         1
Fetal Heart Rate(bpm):  135
Cardiac Activity:       Observed
Presentation:           Variable
Placenta:               Posterior

Amniotic Fluid
AFI FV:      Within normal limits

Largest Pocket(cm)
7.16

Comment:    No placental abruption or previa identified. No subchorionic
hemorrhage seen.
OB History

Gravidity:    6          SAB:   5
Gestational Age

LMP:           20w 4d        Date:  02/19/18                 EDD:   11/26/18
Best:          20w 4d     Det. By:  LMP  (02/19/18)          EDD:   11/26/18
Anatomy

Ventricles:            Appears normal         Bladder:                Appears normal
Choroid Plexus:        Appears normal         Upper Extremities:      Visualized
Stomach:               Appears normal, left   Lower Extremities:      Visualized
sided
Kidneys:               Appear normal
Targeted Anatomy

Central Nervous System
Lateral Ventricles:    7.2 mm
Cervix Uterus Adnexa

Cervix
Length:            3.4  cm.
Appears closed, without funnelling. Normal appearance by
transvaginal scan

Uterus
No abnormality visualized.

Cul De Sac
No free fluid seen.
Comments

Ultrasound images are reviewed.  No evidecnce of placenta
previa or abruption
Recommendations: 1) Follow-up as clinically indicated
Recommendations

Follow-up as clinically indicated

## 2019-03-28 DIAGNOSIS — Z0000 Encounter for general adult medical examination without abnormal findings: Secondary | ICD-10-CM | POA: Diagnosis not present

## 2019-03-28 DIAGNOSIS — Z23 Encounter for immunization: Secondary | ICD-10-CM | POA: Diagnosis not present

## 2019-03-28 DIAGNOSIS — Z6827 Body mass index (BMI) 27.0-27.9, adult: Secondary | ICD-10-CM | POA: Diagnosis not present

## 2019-03-31 DIAGNOSIS — Z1159 Encounter for screening for other viral diseases: Secondary | ICD-10-CM | POA: Diagnosis not present

## 2019-03-31 DIAGNOSIS — L259 Unspecified contact dermatitis, unspecified cause: Secondary | ICD-10-CM | POA: Diagnosis not present

## 2019-03-31 DIAGNOSIS — Z91018 Allergy to other foods: Secondary | ICD-10-CM | POA: Diagnosis not present

## 2019-03-31 DIAGNOSIS — R1013 Epigastric pain: Secondary | ICD-10-CM | POA: Diagnosis not present

## 2019-03-31 DIAGNOSIS — Z0000 Encounter for general adult medical examination without abnormal findings: Secondary | ICD-10-CM | POA: Diagnosis not present

## 2019-04-08 DIAGNOSIS — Z3483 Encounter for supervision of other normal pregnancy, third trimester: Secondary | ICD-10-CM | POA: Diagnosis not present

## 2019-04-08 DIAGNOSIS — Z3482 Encounter for supervision of other normal pregnancy, second trimester: Secondary | ICD-10-CM | POA: Diagnosis not present

## 2019-07-24 DIAGNOSIS — Z6828 Body mass index (BMI) 28.0-28.9, adult: Secondary | ICD-10-CM | POA: Diagnosis not present

## 2019-07-24 DIAGNOSIS — Z01419 Encounter for gynecological examination (general) (routine) without abnormal findings: Secondary | ICD-10-CM | POA: Diagnosis not present

## 2019-07-25 DIAGNOSIS — Z6828 Body mass index (BMI) 28.0-28.9, adult: Secondary | ICD-10-CM | POA: Diagnosis not present

## 2019-07-25 DIAGNOSIS — F988 Other specified behavioral and emotional disorders with onset usually occurring in childhood and adolescence: Secondary | ICD-10-CM | POA: Diagnosis not present

## 2019-09-24 DIAGNOSIS — U071 COVID-19: Secondary | ICD-10-CM | POA: Diagnosis not present

## 2020-07-09 DIAGNOSIS — Z20822 Contact with and (suspected) exposure to covid-19: Secondary | ICD-10-CM | POA: Diagnosis not present

## 2020-07-09 DIAGNOSIS — J069 Acute upper respiratory infection, unspecified: Secondary | ICD-10-CM | POA: Diagnosis not present

## 2020-07-09 DIAGNOSIS — M791 Myalgia, unspecified site: Secondary | ICD-10-CM | POA: Diagnosis not present

## 2020-09-07 DIAGNOSIS — Z01411 Encounter for gynecological examination (general) (routine) with abnormal findings: Secondary | ICD-10-CM | POA: Diagnosis not present

## 2020-09-07 DIAGNOSIS — F529 Unspecified sexual dysfunction not due to a substance or known physiological condition: Secondary | ICD-10-CM | POA: Diagnosis not present

## 2020-09-07 DIAGNOSIS — Z113 Encounter for screening for infections with a predominantly sexual mode of transmission: Secondary | ICD-10-CM | POA: Diagnosis not present

## 2020-09-07 DIAGNOSIS — Z124 Encounter for screening for malignant neoplasm of cervix: Secondary | ICD-10-CM | POA: Diagnosis not present

## 2020-09-07 DIAGNOSIS — Z01419 Encounter for gynecological examination (general) (routine) without abnormal findings: Secondary | ICD-10-CM | POA: Diagnosis not present

## 2020-09-07 DIAGNOSIS — Z6825 Body mass index (BMI) 25.0-25.9, adult: Secondary | ICD-10-CM | POA: Diagnosis not present

## 2020-09-07 DIAGNOSIS — Z309 Encounter for contraceptive management, unspecified: Secondary | ICD-10-CM | POA: Diagnosis not present

## 2020-10-14 DIAGNOSIS — M545 Low back pain, unspecified: Secondary | ICD-10-CM | POA: Diagnosis not present

## 2020-10-14 DIAGNOSIS — Z7689 Persons encountering health services in other specified circumstances: Secondary | ICD-10-CM | POA: Diagnosis not present

## 2020-10-14 DIAGNOSIS — Z91018 Allergy to other foods: Secondary | ICD-10-CM | POA: Diagnosis not present

## 2020-10-14 DIAGNOSIS — Z6825 Body mass index (BMI) 25.0-25.9, adult: Secondary | ICD-10-CM | POA: Diagnosis not present

## 2020-10-14 DIAGNOSIS — E782 Mixed hyperlipidemia: Secondary | ICD-10-CM | POA: Diagnosis not present

## 2020-10-14 DIAGNOSIS — Z0000 Encounter for general adult medical examination without abnormal findings: Secondary | ICD-10-CM | POA: Diagnosis not present

## 2020-11-22 DIAGNOSIS — R17 Unspecified jaundice: Secondary | ICD-10-CM | POA: Diagnosis not present

## 2021-02-03 ENCOUNTER — Telehealth: Payer: 59 | Admitting: Physician Assistant

## 2021-02-03 DIAGNOSIS — B9789 Other viral agents as the cause of diseases classified elsewhere: Secondary | ICD-10-CM

## 2021-02-03 DIAGNOSIS — J019 Acute sinusitis, unspecified: Secondary | ICD-10-CM

## 2021-02-03 MED ORDER — AZELASTINE HCL 0.1 % NA SOLN: 1.0000 | Freq: Two times a day (BID) | NASAL | 0 refills | Status: AC

## 2021-02-03 NOTE — Progress Notes (Signed)
I have spent 5 minutes in review of e-visit questionnaire, review and updating patient chart, medical decision making and response to patient.   Walburga Hudman Cody Teighan Aubert, PA-C    

## 2021-02-03 NOTE — Progress Notes (Signed)
We are sorry that you are not feeling well.  Here is how we plan to help!  Based on what you have shared with me it looks like you have sinusitis.  Sinusitis is inflammation and infection in the sinus cavities of the head.  Based on your presentation I believe you most likely have Acute Viral Sinusitis.This is an infection most likely caused by a virus. There is not specific treatment for viral sinusitis other than to help you with the symptoms until the infection runs its course.  You may use an oral decongestant such as Mucinex D or if you have glaucoma or high blood pressure use plain Mucinex. Saline nasal spray help and can safely be used as often as needed for congestion, I have prescribed: Azelastine nasal spray 2 sprays in each nostril twice a day to take along with your Flonase for better relief of symptoms including pressure and pain in the inner ear.   Some authorities believe that zinc sprays or the use of Echinacea may shorten the course of your symptoms.  Sinus infections are not as easily transmitted as other respiratory infection, however we still recommend that you avoid close contact with loved ones, especially the very young and elderly.  Remember to wash your hands thoroughly throughout the day as this is the number one way to prevent the spread of infection!  Home Care:  Only take medications as instructed by your medical team.  Do not take these medications with alcohol.  A steam or ultrasonic humidifier can help congestion.  You can place a towel over your head and breathe in the steam from hot water coming from a faucet.  Avoid close contacts especially the very young and the elderly.  Cover your mouth when you cough or sneeze.  Always remember to wash your hands.  Get Help Right Away If:  You develop worsening fever or sinus pain.  You develop a severe head ache or visual changes.  Your symptoms persist after you have completed your treatment plan.  Make sure  you  Understand these instructions.  Will watch your condition.  Will get help right away if you are not doing well or get worse.  Your e-visit answers were reviewed by a board certified advanced clinical practitioner to complete your personal care plan.  Depending on the condition, your plan could have included both over the counter or prescription medications.  If there is a problem please reply  once you have received a response from your provider.  Your safety is important to [place].  If you have drug allergies check your prescription carefully.    You can use MyChart to ask questions about today's visit, request a non-urgent call back, or ask for a work or school excuse for 24 hours related to this e-Visit. If it has been greater than 24 hours you will need to follow up with your provider, or enter a new e-Visit to address those concerns.  You will get an e-mail in the next two days asking about your experience.  I hope that your e-visit has been valuable and will speed your recovery. Thank you for using e-visits.

## 2021-04-07 ENCOUNTER — Other Ambulatory Visit (HOSPITAL_COMMUNITY): Payer: Self-pay

## 2021-04-07 DIAGNOSIS — Z3169 Encounter for other general counseling and advice on procreation: Secondary | ICD-10-CM | POA: Diagnosis not present

## 2021-04-07 MED ORDER — CHORIOGONADOTROPIN ALFA 250 MCG/0.5ML ~~LOC~~ INJ
INJECTION | SUBCUTANEOUS | 3 refills | Status: DC
  Filled 2021-04-07: qty 0.5, 30d supply, fill #0
  Filled 2021-05-17 – 2021-05-23 (×2): qty 0.5, 1d supply, fill #0

## 2021-04-07 MED ORDER — PROGESTERONE 200 MG PO CAPS
ORAL_CAPSULE | ORAL | 3 refills | Status: DC
  Filled 2021-04-07 – 2021-05-10 (×2): qty 60, 30d supply, fill #0
  Filled 2021-06-28: qty 60, 30d supply, fill #1

## 2021-04-13 DIAGNOSIS — F411 Generalized anxiety disorder: Secondary | ICD-10-CM | POA: Diagnosis not present

## 2021-04-13 DIAGNOSIS — Z6825 Body mass index (BMI) 25.0-25.9, adult: Secondary | ICD-10-CM | POA: Diagnosis not present

## 2021-04-15 ENCOUNTER — Other Ambulatory Visit (HOSPITAL_COMMUNITY): Payer: Self-pay

## 2021-05-10 ENCOUNTER — Other Ambulatory Visit (HOSPITAL_COMMUNITY): Payer: Self-pay

## 2021-05-17 ENCOUNTER — Other Ambulatory Visit (HOSPITAL_COMMUNITY): Payer: Self-pay

## 2021-05-23 ENCOUNTER — Other Ambulatory Visit (HOSPITAL_COMMUNITY): Payer: Self-pay

## 2021-05-24 ENCOUNTER — Other Ambulatory Visit (HOSPITAL_COMMUNITY): Payer: Self-pay

## 2021-05-26 DIAGNOSIS — Z3169 Encounter for other general counseling and advice on procreation: Secondary | ICD-10-CM | POA: Diagnosis not present

## 2021-06-14 DIAGNOSIS — Z3200 Encounter for pregnancy test, result unknown: Secondary | ICD-10-CM | POA: Diagnosis not present

## 2021-06-16 DIAGNOSIS — Z3200 Encounter for pregnancy test, result unknown: Secondary | ICD-10-CM | POA: Diagnosis not present

## 2021-06-28 ENCOUNTER — Other Ambulatory Visit (HOSPITAL_COMMUNITY): Payer: Self-pay

## 2021-07-05 DIAGNOSIS — Z3A00 Weeks of gestation of pregnancy not specified: Secondary | ICD-10-CM | POA: Diagnosis not present

## 2021-07-05 DIAGNOSIS — O0901 Supervision of pregnancy with history of infertility, first trimester: Secondary | ICD-10-CM | POA: Diagnosis not present

## 2021-07-26 DIAGNOSIS — Z118 Encounter for screening for other infectious and parasitic diseases: Secondary | ICD-10-CM | POA: Diagnosis not present

## 2021-07-26 DIAGNOSIS — Z3689 Encounter for other specified antenatal screening: Secondary | ICD-10-CM | POA: Diagnosis not present

## 2021-07-26 DIAGNOSIS — O30041 Twin pregnancy, dichorionic/diamniotic, first trimester: Secondary | ICD-10-CM | POA: Diagnosis not present

## 2021-07-26 DIAGNOSIS — Z3A10 10 weeks gestation of pregnancy: Secondary | ICD-10-CM | POA: Diagnosis not present

## 2021-07-26 DIAGNOSIS — Z3481 Encounter for supervision of other normal pregnancy, first trimester: Secondary | ICD-10-CM | POA: Diagnosis not present

## 2021-07-26 DIAGNOSIS — Z36 Encounter for antenatal screening for chromosomal anomalies: Secondary | ICD-10-CM | POA: Diagnosis not present

## 2021-07-26 LAB — OB RESULTS CONSOLE ANTIBODY SCREEN: Antibody Screen: NEGATIVE

## 2021-07-26 LAB — OB RESULTS CONSOLE ABO/RH: RH Type: POSITIVE

## 2021-07-26 LAB — OB RESULTS CONSOLE RUBELLA ANTIBODY, IGM: Rubella: IMMUNE

## 2021-07-26 LAB — OB RESULTS CONSOLE HIV ANTIBODY (ROUTINE TESTING): HIV: NONREACTIVE

## 2021-07-26 LAB — OB RESULTS CONSOLE GC/CHLAMYDIA
Chlamydia: NEGATIVE
Gonorrhea: NEGATIVE

## 2021-07-26 LAB — OB RESULTS CONSOLE HEPATITIS B SURFACE ANTIGEN: Hepatitis B Surface Ag: NEGATIVE

## 2021-07-26 LAB — OB RESULTS CONSOLE RPR: RPR: NONREACTIVE

## 2021-08-12 DIAGNOSIS — O0901 Supervision of pregnancy with history of infertility, first trimester: Secondary | ICD-10-CM | POA: Diagnosis not present

## 2021-08-17 DIAGNOSIS — O30041 Twin pregnancy, dichorionic/diamniotic, first trimester: Secondary | ICD-10-CM | POA: Diagnosis not present

## 2021-08-17 DIAGNOSIS — Z8759 Personal history of other complications of pregnancy, childbirth and the puerperium: Secondary | ICD-10-CM | POA: Diagnosis not present

## 2021-08-17 DIAGNOSIS — Z36 Encounter for antenatal screening for chromosomal anomalies: Secondary | ICD-10-CM | POA: Diagnosis not present

## 2021-08-17 DIAGNOSIS — K602 Anal fissure, unspecified: Secondary | ICD-10-CM | POA: Diagnosis not present

## 2021-08-17 DIAGNOSIS — O26891 Other specified pregnancy related conditions, first trimester: Secondary | ICD-10-CM | POA: Diagnosis not present

## 2021-08-17 DIAGNOSIS — Z3481 Encounter for supervision of other normal pregnancy, first trimester: Secondary | ICD-10-CM | POA: Diagnosis not present

## 2021-08-17 DIAGNOSIS — Z3A13 13 weeks gestation of pregnancy: Secondary | ICD-10-CM | POA: Diagnosis not present

## 2021-08-29 DIAGNOSIS — O368990 Maternal care for other specified fetal problems, unspecified trimester, not applicable or unspecified: Secondary | ICD-10-CM | POA: Diagnosis not present

## 2021-08-29 DIAGNOSIS — O30042 Twin pregnancy, dichorionic/diamniotic, second trimester: Secondary | ICD-10-CM | POA: Diagnosis not present

## 2021-08-29 DIAGNOSIS — R109 Unspecified abdominal pain: Secondary | ICD-10-CM | POA: Diagnosis not present

## 2021-08-29 DIAGNOSIS — Z3A15 15 weeks gestation of pregnancy: Secondary | ICD-10-CM | POA: Diagnosis not present

## 2021-08-30 ENCOUNTER — Other Ambulatory Visit (HOSPITAL_COMMUNITY): Payer: Self-pay | Admitting: Obstetrics & Gynecology

## 2021-08-30 ENCOUNTER — Other Ambulatory Visit: Payer: Self-pay | Admitting: Obstetrics & Gynecology

## 2021-08-30 DIAGNOSIS — R109 Unspecified abdominal pain: Secondary | ICD-10-CM

## 2021-08-31 ENCOUNTER — Ambulatory Visit (HOSPITAL_COMMUNITY)
Admission: RE | Admit: 2021-08-31 | Discharge: 2021-08-31 | Disposition: A | Discharge status: Home / Self Care | Payer: 59 | Source: Ambulatory Visit | Attending: Obstetrics & Gynecology | Admitting: Obstetrics & Gynecology

## 2021-08-31 ENCOUNTER — Other Ambulatory Visit: Payer: Self-pay

## 2021-08-31 DIAGNOSIS — R109 Unspecified abdominal pain: Secondary | ICD-10-CM | POA: Insufficient documentation

## 2021-09-08 DIAGNOSIS — Z3A16 16 weeks gestation of pregnancy: Secondary | ICD-10-CM | POA: Diagnosis not present

## 2021-09-08 DIAGNOSIS — O30041 Twin pregnancy, dichorionic/diamniotic, first trimester: Secondary | ICD-10-CM | POA: Diagnosis not present

## 2021-09-08 DIAGNOSIS — Z361 Encounter for antenatal screening for raised alphafetoprotein level: Secondary | ICD-10-CM | POA: Diagnosis not present

## 2021-09-08 DIAGNOSIS — Z3482 Encounter for supervision of other normal pregnancy, second trimester: Secondary | ICD-10-CM | POA: Diagnosis not present

## 2021-09-25 NOTE — L&D Delivery Note (Addendum)
Delivery Note ?Pudendal block given w/ 20 cc 1% plain lidocaine.  ?NICU team in attendance  ?  ?Jenisse, Vullo [place] [[id number]]  ?At 7:32 PM a viable and healthy female was delivered via Vaginal, Spontaneous (Presentation: LOA).  APGAR: 8 and 9 ; weight  pending.   ? ?AROM once head engaged in pelvis,. Clear fluid. ?  Koralee, Wedeking [[id number]]  ?At 7:59 PM a viable and healthy female was delivered via Vaginal, Spontaneous (Presentation: OP).  APGAR: 8 and 9; weight pending  ? ?Placenta status- both together, spontaneous and complete,Cord:  with the following complications: none  ? ?Anesthesia: Pudendal block and local anesthetic injection for repair  ?Episiotomy: Median (in old scar)- 2nd degree. No extensions  ?Lacerations:  none other  ?Suture Repair: 3.0 vicryl rapide ?Est. Blood Loss (mL):  400 cc ? ?Prophylactic Cytotec placed rectally due to PPH risk  ? ? ?Mom to postpartum.   ?Baby A to Couplet care / Skin to Skin.   ?Baby B to Couplet care / Skin to Skin. ? ?Robley Fries ?01/31/2022, 8:30 PM  ?

## 2021-09-26 DIAGNOSIS — J019 Acute sinusitis, unspecified: Secondary | ICD-10-CM | POA: Diagnosis not present

## 2021-09-26 DIAGNOSIS — Z6828 Body mass index (BMI) 28.0-28.9, adult: Secondary | ICD-10-CM | POA: Diagnosis not present

## 2021-09-26 DIAGNOSIS — Z3490 Encounter for supervision of normal pregnancy, unspecified, unspecified trimester: Secondary | ICD-10-CM | POA: Diagnosis not present

## 2021-09-28 DIAGNOSIS — Z3A19 19 weeks gestation of pregnancy: Secondary | ICD-10-CM | POA: Diagnosis not present

## 2021-09-28 DIAGNOSIS — O30042 Twin pregnancy, dichorionic/diamniotic, second trimester: Secondary | ICD-10-CM | POA: Diagnosis not present

## 2021-10-05 DIAGNOSIS — Z3A20 20 weeks gestation of pregnancy: Secondary | ICD-10-CM | POA: Diagnosis not present

## 2021-10-05 DIAGNOSIS — O30042 Twin pregnancy, dichorionic/diamniotic, second trimester: Secondary | ICD-10-CM | POA: Diagnosis not present

## 2021-10-05 DIAGNOSIS — O99891 Other specified diseases and conditions complicating pregnancy: Secondary | ICD-10-CM | POA: Diagnosis not present

## 2021-10-19 DIAGNOSIS — O09292 Supervision of pregnancy with other poor reproductive or obstetric history, second trimester: Secondary | ICD-10-CM | POA: Diagnosis not present

## 2021-10-19 DIAGNOSIS — Z363 Encounter for antenatal screening for malformations: Secondary | ICD-10-CM | POA: Diagnosis not present

## 2021-10-19 DIAGNOSIS — O30042 Twin pregnancy, dichorionic/diamniotic, second trimester: Secondary | ICD-10-CM | POA: Diagnosis not present

## 2021-10-19 DIAGNOSIS — Z3A22 22 weeks gestation of pregnancy: Secondary | ICD-10-CM | POA: Diagnosis not present

## 2021-11-09 DIAGNOSIS — Z3689 Encounter for other specified antenatal screening: Secondary | ICD-10-CM | POA: Diagnosis not present

## 2021-11-09 DIAGNOSIS — O30042 Twin pregnancy, dichorionic/diamniotic, second trimester: Secondary | ICD-10-CM | POA: Diagnosis not present

## 2021-11-09 DIAGNOSIS — Z3A25 25 weeks gestation of pregnancy: Secondary | ICD-10-CM | POA: Diagnosis not present

## 2021-11-09 DIAGNOSIS — O09292 Supervision of pregnancy with other poor reproductive or obstetric history, second trimester: Secondary | ICD-10-CM | POA: Diagnosis not present

## 2021-11-30 DIAGNOSIS — Z3A28 28 weeks gestation of pregnancy: Secondary | ICD-10-CM | POA: Diagnosis not present

## 2021-11-30 DIAGNOSIS — O30042 Twin pregnancy, dichorionic/diamniotic, second trimester: Secondary | ICD-10-CM | POA: Diagnosis not present

## 2021-11-30 DIAGNOSIS — Z8759 Personal history of other complications of pregnancy, childbirth and the puerperium: Secondary | ICD-10-CM | POA: Diagnosis not present

## 2021-11-30 DIAGNOSIS — Z3689 Encounter for other specified antenatal screening: Secondary | ICD-10-CM | POA: Diagnosis not present

## 2021-11-30 DIAGNOSIS — Z3482 Encounter for supervision of other normal pregnancy, second trimester: Secondary | ICD-10-CM | POA: Diagnosis not present

## 2021-12-08 DIAGNOSIS — J0101 Acute recurrent maxillary sinusitis: Secondary | ICD-10-CM | POA: Diagnosis not present

## 2021-12-08 DIAGNOSIS — Z6830 Body mass index (BMI) 30.0-30.9, adult: Secondary | ICD-10-CM | POA: Diagnosis not present

## 2021-12-08 DIAGNOSIS — J029 Acute pharyngitis, unspecified: Secondary | ICD-10-CM | POA: Diagnosis not present

## 2021-12-08 DIAGNOSIS — Z3490 Encounter for supervision of normal pregnancy, unspecified, unspecified trimester: Secondary | ICD-10-CM | POA: Diagnosis not present

## 2021-12-14 DIAGNOSIS — Z8759 Personal history of other complications of pregnancy, childbirth and the puerperium: Secondary | ICD-10-CM | POA: Diagnosis not present

## 2021-12-14 DIAGNOSIS — Z23 Encounter for immunization: Secondary | ICD-10-CM | POA: Diagnosis not present

## 2021-12-14 DIAGNOSIS — Z3A30 30 weeks gestation of pregnancy: Secondary | ICD-10-CM | POA: Diagnosis not present

## 2021-12-14 DIAGNOSIS — O30043 Twin pregnancy, dichorionic/diamniotic, third trimester: Secondary | ICD-10-CM | POA: Diagnosis not present

## 2022-01-01 ENCOUNTER — Inpatient Hospital Stay (HOSPITAL_COMMUNITY): Payer: 59

## 2022-01-09 DIAGNOSIS — O30043 Twin pregnancy, dichorionic/diamniotic, third trimester: Secondary | ICD-10-CM | POA: Diagnosis not present

## 2022-01-09 DIAGNOSIS — Z3A34 34 weeks gestation of pregnancy: Secondary | ICD-10-CM | POA: Diagnosis not present

## 2022-01-17 DIAGNOSIS — O30043 Twin pregnancy, dichorionic/diamniotic, third trimester: Secondary | ICD-10-CM | POA: Diagnosis not present

## 2022-01-17 DIAGNOSIS — Z3A35 35 weeks gestation of pregnancy: Secondary | ICD-10-CM | POA: Diagnosis not present

## 2022-01-17 DIAGNOSIS — Z8759 Personal history of other complications of pregnancy, childbirth and the puerperium: Secondary | ICD-10-CM | POA: Diagnosis not present

## 2022-01-17 DIAGNOSIS — Z3483 Encounter for supervision of other normal pregnancy, third trimester: Secondary | ICD-10-CM | POA: Diagnosis not present

## 2022-01-17 DIAGNOSIS — Z3685 Encounter for antenatal screening for Streptococcus B: Secondary | ICD-10-CM | POA: Diagnosis not present

## 2022-01-19 LAB — OB RESULTS CONSOLE GBS: GBS: NEGATIVE

## 2022-01-20 ENCOUNTER — Encounter (HOSPITAL_COMMUNITY): Payer: Self-pay | Admitting: *Deleted

## 2022-01-20 ENCOUNTER — Telehealth (HOSPITAL_COMMUNITY): Payer: Self-pay | Admitting: *Deleted

## 2022-01-20 NOTE — Telephone Encounter (Signed)
Preadmission screen  

## 2022-01-23 ENCOUNTER — Encounter (HOSPITAL_COMMUNITY): Payer: Self-pay | Admitting: *Deleted

## 2022-01-25 DIAGNOSIS — Z3A36 36 weeks gestation of pregnancy: Secondary | ICD-10-CM | POA: Diagnosis not present

## 2022-01-25 DIAGNOSIS — O30043 Twin pregnancy, dichorionic/diamniotic, third trimester: Secondary | ICD-10-CM | POA: Diagnosis not present

## 2022-01-30 ENCOUNTER — Telehealth (HOSPITAL_COMMUNITY): Payer: Self-pay | Admitting: *Deleted

## 2022-01-30 ENCOUNTER — Other Ambulatory Visit: Payer: Self-pay | Admitting: Obstetrics & Gynecology

## 2022-01-30 NOTE — Telephone Encounter (Signed)
Preadmission screen  

## 2022-01-31 ENCOUNTER — Inpatient Hospital Stay (HOSPITAL_COMMUNITY)
Admission: AD | Admit: 2022-01-31 | Discharge: 2022-02-02 | DRG: 807 | Disposition: A | Discharge status: Home / Self Care | Payer: 59 | Attending: Obstetrics & Gynecology | Admitting: Obstetrics & Gynecology

## 2022-01-31 ENCOUNTER — Encounter (HOSPITAL_COMMUNITY): Payer: Self-pay | Admitting: Obstetrics & Gynecology

## 2022-01-31 ENCOUNTER — Other Ambulatory Visit: Payer: Self-pay

## 2022-01-31 ENCOUNTER — Inpatient Hospital Stay (HOSPITAL_COMMUNITY): Payer: 59

## 2022-01-31 DIAGNOSIS — O365931 Maternal care for other known or suspected poor fetal growth, third trimester, fetus 1: Secondary | ICD-10-CM | POA: Diagnosis not present

## 2022-01-31 DIAGNOSIS — Z87891 Personal history of nicotine dependence: Secondary | ICD-10-CM | POA: Diagnosis not present

## 2022-01-31 DIAGNOSIS — Z3490 Encounter for supervision of normal pregnancy, unspecified, unspecified trimester: Secondary | ICD-10-CM | POA: Insufficient documentation

## 2022-01-31 DIAGNOSIS — O30043 Twin pregnancy, dichorionic/diamniotic, third trimester: Principal | ICD-10-CM | POA: Diagnosis present

## 2022-01-31 DIAGNOSIS — Z3A37 37 weeks gestation of pregnancy: Secondary | ICD-10-CM | POA: Diagnosis not present

## 2022-01-31 DIAGNOSIS — O30049 Twin pregnancy, dichorionic/diamniotic, unspecified trimester: Secondary | ICD-10-CM | POA: Diagnosis present

## 2022-01-31 DIAGNOSIS — O30009 Twin pregnancy, unspecified number of placenta and unspecified number of amniotic sacs, unspecified trimester: Secondary | ICD-10-CM | POA: Diagnosis present

## 2022-01-31 HISTORY — DX: Twin pregnancy, dichorionic/diamniotic, unspecified trimester: O30.049

## 2022-01-31 LAB — CBC
HCT: 38.1 % (ref 36.0–46.0)
Hemoglobin: 13.9 g/dL (ref 12.0–15.0)
MCH: 33.1 pg (ref 26.0–34.0)
MCHC: 36.5 g/dL — ABNORMAL HIGH (ref 30.0–36.0)
MCV: 90.7 fL (ref 80.0–100.0)
Platelets: 202 10*3/uL (ref 150–400)
RBC: 4.2 MIL/uL (ref 3.87–5.11)
RDW: 12.9 % (ref 11.5–15.5)
WBC: 9.2 10*3/uL (ref 4.0–10.5)
nRBC: 0 % (ref 0.0–0.2)

## 2022-01-31 LAB — TYPE AND SCREEN
ABO/RH(D): A POS
Antibody Screen: NEGATIVE

## 2022-01-31 LAB — RPR: RPR Ser Ql: NONREACTIVE

## 2022-01-31 MED ORDER — EPHEDRINE 5 MG/ML INJ: 10.0000 mg | INTRAVENOUS | Status: DC | PRN

## 2022-01-31 MED ORDER — SOD CITRATE-CITRIC ACID 500-334 MG/5ML PO SOLN: 30.0000 mL | ORAL | Status: DC | PRN

## 2022-01-31 MED ORDER — KETOROLAC TROMETHAMINE 30 MG/ML IJ SOLN
INTRAMUSCULAR | Status: AC
  Filled 2022-01-31: qty 2

## 2022-01-31 MED ORDER — ZOLPIDEM TARTRATE 5 MG PO TABS: 5.0000 mg | ORAL_TABLET | Freq: Every evening | ORAL | Status: DC | PRN

## 2022-01-31 MED ORDER — PHENYLEPHRINE 80 MCG/ML (10ML) SYRINGE FOR IV PUSH (FOR BLOOD PRESSURE SUPPORT): 80.0000 ug | PREFILLED_SYRINGE | INTRAVENOUS | Status: DC | PRN

## 2022-01-31 MED ORDER — FENTANYL CITRATE (PF) 100 MCG/2ML IJ SOLN
INTRAMUSCULAR | Status: AC
  Filled 2022-01-31: qty 2

## 2022-01-31 MED ORDER — ACETAMINOPHEN 325 MG PO TABS: 650.0000 mg | ORAL_TABLET | ORAL | Status: DC | PRN

## 2022-01-31 MED ORDER — TETANUS-DIPHTH-ACELL PERTUSSIS 5-2.5-18.5 LF-MCG/0.5 IM SUSY
0.5000 mL | PREFILLED_SYRINGE | Freq: Once | INTRAMUSCULAR | Status: DC
Start: 2022-02-01 — End: 2022-02-02

## 2022-01-31 MED ORDER — LACTATED RINGERS IV SOLN: 500.0000 mL | INTRAVENOUS | Status: DC | PRN

## 2022-01-31 MED ORDER — LACTATED RINGERS IV SOLN: 500.0000 mL | Freq: Once | INTRAVENOUS | Status: DC

## 2022-01-31 MED ORDER — PRENATAL MULTIVITAMIN CH
1.0000 | ORAL_TABLET | Freq: Every day | ORAL | Status: DC
  Administered 2022-02-01 – 2022-02-02 (×2): 1 via ORAL
  Filled 2022-01-31 (×2): qty 1

## 2022-01-31 MED ORDER — ONDANSETRON HCL 4 MG/2ML IJ SOLN: 4.0000 mg | Freq: Four times a day (QID) | INTRAMUSCULAR | Status: DC | PRN

## 2022-01-31 MED ORDER — ONDANSETRON HCL 4 MG PO TABS
4.0000 mg | ORAL_TABLET | ORAL | Status: DC | PRN
Start: 2022-01-31 — End: 2022-02-02

## 2022-01-31 MED ORDER — TERBUTALINE SULFATE 1 MG/ML IJ SOLN: 0.2500 mg | Freq: Once | INTRAMUSCULAR | Status: DC | PRN

## 2022-01-31 MED ORDER — FENTANYL CITRATE (PF) 100 MCG/2ML IJ SOLN
100.0000 ug | INTRAMUSCULAR | Status: DC | PRN
  Administered 2022-01-31: 100 ug via INTRAVENOUS

## 2022-01-31 MED ORDER — DIBUCAINE (PERIANAL) 1 % EX OINT: 1.0000 "application " | TOPICAL_OINTMENT | CUTANEOUS | Status: DC | PRN

## 2022-01-31 MED ORDER — ACETAMINOPHEN 325 MG PO TABS
650.0000 mg | ORAL_TABLET | ORAL | Status: DC | PRN
  Administered 2022-02-01 (×3): 650 mg via ORAL
  Filled 2022-01-31 (×3): qty 2

## 2022-01-31 MED ORDER — ONDANSETRON HCL 4 MG/2ML IJ SOLN: 4.0000 mg | INTRAMUSCULAR | Status: DC | PRN

## 2022-01-31 MED ORDER — KETOROLAC TROMETHAMINE 30 MG/ML IJ SOLN
30.0000 mg | Freq: Once | INTRAMUSCULAR | Status: AC
Start: 2022-01-31 — End: 2022-01-31
  Administered 2022-01-31: 30 mg via INTRAMUSCULAR

## 2022-01-31 MED ORDER — OXYTOCIN-SODIUM CHLORIDE 30-0.9 UT/500ML-% IV SOLN
1.0000 m[IU]/min | INTRAVENOUS | Status: DC
  Administered 2022-01-31: 2 m[IU]/min via INTRAVENOUS

## 2022-01-31 MED ORDER — IBUPROFEN 600 MG PO TABS
600.0000 mg | ORAL_TABLET | Freq: Four times a day (QID) | ORAL | Status: DC
  Administered 2022-02-01 – 2022-02-02 (×7): 600 mg via ORAL
  Filled 2022-01-31 (×7): qty 1

## 2022-01-31 MED ORDER — DIPHENHYDRAMINE HCL 25 MG PO CAPS: 25.0000 mg | ORAL_CAPSULE | Freq: Four times a day (QID) | ORAL | Status: DC | PRN

## 2022-01-31 MED ORDER — MISOPROSTOL 200 MCG PO TABS
ORAL_TABLET | ORAL | Status: AC
  Filled 2022-01-31: qty 5

## 2022-01-31 MED ORDER — ACETAMINOPHEN 500 MG PO TABS
ORAL_TABLET | ORAL | Status: AC
  Filled 2022-01-31: qty 2

## 2022-01-31 MED ORDER — OXYTOCIN-SODIUM CHLORIDE 30-0.9 UT/500ML-% IV SOLN
2.5000 [IU]/h | INTRAVENOUS | Status: DC
  Administered 2022-01-31: 2.5 [IU]/h via INTRAVENOUS
  Filled 2022-01-31 (×2): qty 500

## 2022-01-31 MED ORDER — OXYCODONE-ACETAMINOPHEN 5-325 MG PO TABS: 2.0000 | ORAL_TABLET | ORAL | Status: DC | PRN

## 2022-01-31 MED ORDER — LIDOCAINE HCL (PF) 1 % IJ SOLN
INTRAMUSCULAR | Status: AC
  Administered 2022-01-31: 30 mL
  Filled 2022-01-31: qty 30

## 2022-01-31 MED ORDER — LACTATED RINGERS IV SOLN: INTRAVENOUS | Status: DC

## 2022-01-31 MED ORDER — SENNOSIDES-DOCUSATE SODIUM 8.6-50 MG PO TABS
2.0000 | ORAL_TABLET | Freq: Every day | ORAL | Status: DC
  Filled 2022-01-31 (×2): qty 2

## 2022-01-31 MED ORDER — SIMETHICONE 80 MG PO CHEW: 80.0000 mg | CHEWABLE_TABLET | ORAL | Status: DC | PRN

## 2022-01-31 MED ORDER — KETOROLAC TROMETHAMINE 30 MG/ML IJ SOLN
30.0000 mg | Freq: Once | INTRAMUSCULAR | Status: AC
  Administered 2022-01-31: 30 mg via INTRAVENOUS

## 2022-01-31 MED ORDER — OXYCODONE HCL 5 MG PO TABS
5.0000 mg | ORAL_TABLET | Freq: Four times a day (QID) | ORAL | Status: DC | PRN
  Administered 2022-02-01: 5 mg via ORAL
  Filled 2022-01-31: qty 1

## 2022-01-31 MED ORDER — WITCH HAZEL-GLYCERIN EX PADS
1.0000 | MEDICATED_PAD | CUTANEOUS | Status: DC | PRN
Start: 2022-01-31 — End: 2022-02-02

## 2022-01-31 MED ORDER — ACETAMINOPHEN 500 MG PO TABS
1000.0000 mg | ORAL_TABLET | Freq: Once | ORAL | Status: AC
  Administered 2022-01-31: 1000 mg via ORAL

## 2022-01-31 MED ORDER — COCONUT OIL OIL: 1.0000 "application " | TOPICAL_OIL | Status: DC | PRN

## 2022-01-31 MED ORDER — OXYTOCIN BOLUS FROM INFUSION
333.0000 mL | Freq: Once | INTRAVENOUS | Status: AC
  Administered 2022-01-31: 333 mL via INTRAVENOUS

## 2022-01-31 MED ORDER — FENTANYL-BUPIVACAINE-NACL 0.5-0.125-0.9 MG/250ML-% EP SOLN: 12.0000 mL/h | EPIDURAL | Status: DC | PRN

## 2022-01-31 MED ORDER — OXYCODONE-ACETAMINOPHEN 5-325 MG PO TABS: 1.0000 | ORAL_TABLET | ORAL | Status: DC | PRN

## 2022-01-31 MED ORDER — LIDOCAINE HCL (PF) 1 % IJ SOLN
30.0000 mL | INTRAMUSCULAR | Status: AC | PRN
  Administered 2022-01-31: 30 mL via SUBCUTANEOUS
  Filled 2022-01-31: qty 30

## 2022-01-31 MED ORDER — DIPHENHYDRAMINE HCL 50 MG/ML IJ SOLN: 12.5000 mg | INTRAMUSCULAR | Status: DC | PRN

## 2022-01-31 MED ORDER — MISOPROSTOL 200 MCG PO TABS
1000.0000 ug | ORAL_TABLET | Freq: Once | ORAL | Status: AC
  Administered 2022-01-31: 1000 ug via RECTAL

## 2022-01-31 MED ORDER — BENZOCAINE-MENTHOL 20-0.5 % EX AERO
1.0000 "application " | INHALATION_SPRAY | CUTANEOUS | Status: DC | PRN
  Filled 2022-01-31: qty 56

## 2022-01-31 NOTE — Progress Notes (Signed)
Latoya Reeves is a 31 y.o. [id number] at [redacted]w[redacted]d by early ultrasound admitted for [name] twins with twin A SGA 37.1 wks  ? ?Subjective: ?Feels occ UCs w/ pain. Deferring epidural since "didn't work"  ? ?Objective: ?BP 117/68   Pulse 68   Temp 98.4 ?F (36.9 ?C) (Oral)   Resp 18   Ht 5\' 6"  (1.676 m)   Wt 89.7 kg   BMI 31.93 kg/m?  ? ?FHT A 130s + accels no decels mod variability cat I  ?FHT B  FHR: 130 bpm, variability: moderate,  accelerations:  Present,  decelerations:  Absent ?UC:   regular, every 3-4 minutes, pitocin ?SVE:   Dilation: 4.5 ?Effacement (%): 70 ?Station: -2 ?Exam by:: Dr. [email or web address] ?AROM, clear  ? ?Labs: ?Lab Results  ?Component Value Date  ? WBC 9.2 01/31/2022  ? HGB 13.9 01/31/2022  ? HCT 38.1 01/31/2022  ? MCV 90.7 01/31/2022  ? PLT 202 01/31/2022  ? ? ?Assessment / Plan: ?Induction of labor due to Twins, twin A SGA ,  progressing well on pitocin, early labor  ?A EFW 5-5.1/2 B 5.1/2-6 lbs. Both cephalic, working towards vaginal delivery. 1st kid was VAVD after 3.30 hrs pushing and exhaustion. Needed episiotomy and VAVD. Wonders if she'll need that.  ?Reviewed SVD plan for now. Twin B always at small risk of needing operative vag delivery or breech extraction or emerg C/s and need for general anesthesia in case of emergency- accepts all  ? ?Labor: Progressing normally ?Preeclampsia:   NA ?Fetal Wellbeing:  Category I  x2 ?Pain Control:  Labor support without medications ?I/D:  n/a ?Anticipated MOD:  NSVD of twins  ? ?04/02/2022 Latoya Reeves ?01/31/2022, 2:26 PM ? ? ?

## 2022-01-31 NOTE — H&P (Signed)
Latoya Reeves is a 31 y.o. female presenting for IOL at 37.1wks for [name] twins, wants vaginal delivery.  ? ?[id number], 5 early SABs then REI assisted last conception with HCG trigger. -VAVD, 11/21/2018, girl, Marley, 6 lbs 14.6 oz, uncomplicated preg, 3 hr pushing.  ?Now spontaneous Di-DI twins following HCG trigger, saw Dr Scheryl Darter at [facility] again, was on hormones till 12 wks ?NIPT nl, low risk Boy/ Girl. NT sono.- x 2, nl both at 12 wks  ?Nl twin anatomy. A girl B boy  ?Growth with serial sonos ?25 wks -  Twin A Girl, Cephalic, EFW 1'13" 49% AC 23% AFI nl, placenta anterior. Twin B Boy, Cephalic, EFW 1'15" 69% AC 60% AFI nl, placenta anterior. CL nl at 3.8 cm, no funneling. ?30 wk- - A girl, 3'9" 50% AC 41%, Vx, AFI nl B Boy, 3'15" 80% AC 68%, Vx AFI nl.  ?34 wks-  Twin A- Vx, BPP 8/8, EFW 4'7" 12% AC 4%, AFI nl. UAD- normal. Twin B - Vx, BPP 8/8, EFW 5'10" 71% AC 81% , AFI nl, UAD -normal. 22% discordance but these are D-D twins Female/ female. twin B- left fetal pyelectasis and hydronephrosis  ?35 wk-  [name] twins, Twin A at 12% last week, twin B at 71%. BPP twins- A 8/8. UAD nl. B - 8/8, UAD nl, fetal left pyelectasis 10 mm. AFI wnl. repeat weekly. ? ? ?OB History   ? ? Gravida  ?7  ? Para  ?1  ? Term  ?1  ? Preterm  ?   ? AB  ?5  ? Living  ?1  ?  ? ? SAB  ?5  ? IAB  ?   ? Ectopic  ?   ? Multiple  ?0  ? Live Births  ?1  ?   ?  ?  ? ?Past Medical History:  ?Diagnosis Date  ? Allergy to alpha-gal   ? alpha gal mammalian allergy---- beef , pork, lamb, mutton, deer  ? Eczema   ? Environmental and seasonal allergies   ? 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 History of recurrent miscarriages   ? Perennial allergic rhinitis   ? Term pregnancy 11/20/2018  ? ?Past Surgical History:  ?Procedure Laterality Date  ? HYSTEROSCOPY N/A 10/05/2017  ? Procedure: HYSTEROSCOPY, DILATION AND CURETTAGE;  Surgeon: Waynard Reeds, MD;  Location: [facility];  Service: Gynecology;  Laterality: N/A;  ?  LAPAROSCOPIC CHOLECYSTECTOMY  12/2012  ? TYMPANOSTOMY TUBE PLACEMENT Bilateral child  ? ?Family History: family history includes Allergic rhinitis in her sister; Eczema in her father; Other in her sister. ?Social History:  reports that she quit smoking about 8 years ago. Her smoking use included cigarettes. She has never used smokeless tobacco. She reports that she does not drink alcohol and does not use drugs. ? ? ?  ?Maternal Diabetes: No ?Genetic Screening: Normal  NIPT low risk, AFP neg  NT sono wnl x2 ?Maternal Ultrasounds/Referrals: Normal at anatomy. Twin B (boy) left fetal pyelectasis noted.  Twin A (girl) overall 12% but small AC at 4% at 34 wks , UAD nl.  ?Fetal Ultrasounds or other Referrals:  None ?Maternal Substance Abuse:  No ?Significant Maternal Medications:  None ?Significant Maternal Lab Results:  Group B Strep negative ?Other Comments:   [name] twins, A smaller  ? ?Review of Systems ?History ?Dilation: 3.5 ?Effacement (%): 70 ?Station: -2 ?Exam by:: Earlene Plater, RN ?Blood pressure 116/78, pulse 75, temperature 97.8 ?F (36.6 ?C),  temperature source Oral, resp. rate 16, height 5\' 6"  (1.676 m), weight 89.7 kg, unknown if currently breastfeeding. ?Exam ?Physical Exam  ?BP 127/79   Pulse 66   Temp 97.8 ?F (36.6 ?C) (Oral)   Resp 18   Ht 5\' 6"  (1.676 m)   Wt 89.7 kg   BMI 31.93 kg/m?  ?Physical exam:  ?A&O x 3, no acute distress. Pleasant ?HEENT neg, no thyromegaly ?Lungs CTA bilat ?CV RRR, S1S2 normal ?Abdo soft, non tender, non acute ?Extr no edema/ tenderness ?Pelvic Cx per RN ?FHT  A 130s + accels no decels mod variability - cat I ?FHT  B 130s + accels no decels mod variab cat I  ?Toco Irreg , pitocin  ? ? ?Prenatal labs: ?ABO, Rh: --/--/A POS (05/09 ) ?Antibody: NEG (05/09 0709) ?Rubella: Immune (11/01 0000) ?RPR: Nonreactive (11/01 0000)  ?HBsAg: Negative (11/01 0000)  ?HIV: Non-reactive (11/01 0000)  ?GBS: Negative/-- (04/27 0000)  ?Glucola nl  ?NIPT low risk ?AFP1 neg  ? ?Assessment/Plan: ?31  yo [id number], at 48.1 wks  26 twins with twin A poor fetal growth, Here for IOL for vaginal twin delivery. Both are cephalic, A is smaller. Prior VAVD, wants to try w/out epidural.  ?Low dose pitocin, AROM when possible. Ambulate. Recc Epidural, esp for twin B unanticipated emergency  ?FHT cat I x 2  ?GBS neg ?EFW A 5 lbs and B  6 lbs  ? ?28.3 Benjamin Merrihew ?01/31/2022, 8:56 AM ? ? ? ? ?

## 2022-01-31 NOTE — Plan of Care (Signed)

## 2022-01-31 NOTE — Lactation Note (Signed)
This note was copied from a baby's chart. ?Lactation Consultation Note ? ?Patient Name: Latoya Reeves ?Today's Date: 01/31/2022 ?Reason for consult: L&D Initial assessment;Mother's request;Difficult latch;Early term 37-38.6wks;Multiple gestation;Breastfeeding assistance ?Age:31 hours ? ?Mom feeling jittery earlier. LC assisted latching Baby Girl did a few sucks popping on and off for 6  min.  ?Baby B infant licking and tasting colostrum at the breast, holding nipple in his mouth.  ?Both infants left STS in tandem football with support of Dad and doula.  ?LC to follow up with Mom and twins once on the floor.  ? ?Maternal Data ?Has patient been taught Hand Expression?: Yes ? ?Feeding ?Mother's Current Feeding Choice: Breast Milk ? ?LATCH Score ?Latch: Repeated attempts needed to sustain latch, nipple held in mouth throughout feeding, stimulation needed to elicit sucking reflex. ? ?Audible Swallowing: A few with stimulation ? ?Type of Nipple: Everted at rest and after stimulation ? ?Comfort (Breast/Nipple): Soft / non-tender ? ?Hold (Positioning): Assistance needed to correctly position infant at breast and maintain latch. ? ?LATCH Score: 7 ? ? ?Lactation Tools Discussed/Used ?  ? ?Interventions ?Interventions: Breast feeding basics reviewed;Assisted with latch;Skin to skin;Breast massage;Breast compression;Hand express;Adjust position;Education;Support pillows;Infant Driven Feeding Algorithm education ? ?Discharge ?  ? ?Consult Status ?Consult Status: Follow-up from L&D ?Date: 02/01/22 ? ? ? ?Valicia Rief  Nicholson-Springer ?01/31/2022, 9:19 PM ? ? ? ?

## 2022-02-01 LAB — CBC
HCT: 35.9 % — ABNORMAL LOW (ref 36.0–46.0)
Hemoglobin: 12.7 g/dL (ref 12.0–15.0)
MCH: 32.8 pg (ref 26.0–34.0)
MCHC: 35.4 g/dL (ref 30.0–36.0)
MCV: 92.8 fL (ref 80.0–100.0)
Platelets: 157 10*3/uL (ref 150–400)
RBC: 3.87 MIL/uL (ref 3.87–5.11)
RDW: 12.7 % (ref 11.5–15.5)
WBC: 12.1 10*3/uL — ABNORMAL HIGH (ref 4.0–10.5)
nRBC: 0 % (ref 0.0–0.2)

## 2022-02-01 NOTE — Progress Notes (Signed)
? ? ?PPD # 1 S/P NSVD ? ?  ?Sherell, Christoffel [place] [[id number]]  ?Live born female  ?Birth Weight: 5 lb 4.3 oz (2390 g) ?APGAR: 8, 9 ? ?Newborn Delivery   ?Birth date/time: 01/31/2022 19:32:00 ?Delivery type: Vaginal, Spontaneous ?  ?  ?  ?Anuhea, Gassner [place] [[id number]]  ?Live born female  ?Birth Weight: 6 lb 10.9 oz (3030 g) ?APGAR: 8, 9 ? ?Newborn Delivery   ?Birth date/time: 01/[birth year]/2022 19:59:00 ?Delivery type: Vaginal, Spontaneous ?  ?  ?Baby name: boy Madilyn Fireman, girl [place] ?Delivering provider:  ?  ?Bera, Pinela [place] [[id number]]  ?MODY, VAISHALI  ?  ?Cattaleya, Wien Pablo [[id number]]  ?MODY, VAISHALI  ?Episiotomy: ?  ?Ying, Blank[place] [[id number]]  ?Median  ?  ?Orianna, Biskup [place] [[id number]]  ?Median  ? ?Lacerations: ?  ?Keylen, Uzelac [place] [[id number]]  ?None  ?  ?Cherise, Fedder [place] [[id number]]  ?None  ? ?circumcision planned ? ?Feeding: breast ? ?Pain control at delivery:  ?  ?Arlett, Goold Gwynn [[id number]]  ?Local  ?  ?Marcheta, Horsey [place] [[id number]]  ?Local  ? ?S:  Reports feeling well, sore, but happy with experience. ?            Tolerating po/ No nausea or vomiting ?            Bleeding is light ?            Pain controlled with acetaminophen and ibuprofen (OTC) ?            Up ad lib / ambulatory / voiding without difficulties  ? ?O:  A & O x 3, in no apparent distress  ?            VS:  ?Vitals:  ? 01/31/22 2206 01/31/22 2301 02/01/22 0322 02/01/22 8891  ?BP: (!) 120/52 127/69 117/71 (!) 115/54  ?Pulse: 60 (!) 52 (!) 54 (!) 50  ?Resp: 18 16 17 18   ?Temp: (!) 100.6 ?F (38.1 ?C) 99.9 ?F (37.7 ?C) 98.7 ?F (37.1 ?C) 98.1 ?F (36.7 ?C)  ?TempSrc: Oral Oral Oral Oral  ?SpO2: 99%   97%  ?Weight:      ?Height:      ? ? LABS:  ?Recent Labs  ?  01/31/22 ?0709 02/01/22 ?04/03/22  ?WBC 9.2 12.1*  ?HGB 13.9 12.7  ?HCT 38.1 35.9*  ?PLT 202 157  ? ? Blood type: --/--/A POS (05/09 12-27-1988) ? Rubella: Immune (11/01 0000)  ? I&O: I/O last 3 completed shifts: ?In: -  ?Out: 400 [Blood:400] ?         No intake/output data  recorded. ? ?Vaccines: TDaP          UTD ?        Flu             UTD ?                   COVID-19 declined ? Gen: AAO x 3, NAD ? Abdomen: soft, non-tender, non-distended ?            Fundus: firm, non-tender, U-1 ? Perineum: repair intact. No edema ? Lochia: small ? Extremities: no edema, no calf pain or tenderness ?  ? ?A/P: PPD # 1 31 y.o., 09-27-1973  ? Principal Problem: ?  Postpartum care following vaginal twins delivery 5/9 ?Active Problems: ?  Obstetrical laceration: median episiotomy  ?  Dichorionic diamniotic twin gestation ? ? Doing well - stable status ? Routine post partum orders ? Anticipate discharge tomorrow ?  ? ?  Neta Mends, MSN, CNM ?02/01/2022, 11:53 AM ? ? ?  ?

## 2022-02-01 NOTE — Lactation Note (Addendum)
This note was copied from a baby's chart. ?Lactation Consultation Note ? ?Patient Name: Latoya Reeves ?Today's Date: 02/01/2022 ?Reason for consult: Initial assessment;Early term 37-38.6wks;Infant < 6lbs ?Age:31 hours ? ?LC in to visit with P3 Mom of ET twin babies delivered vaginally.  Both babies in cribs.  Mom states baby girl A who was 5 lbs. 4.3 oz at birth has been interested in breastfeeding and latching for a few minutes, but baby boy B has no interest.   ? ?Baby A placed STS in football hold as she was cueing.  Mom shown how to properly hand express, drops expressed.  Baby rooting a little, not opening her mouth wide enough to sustain a deep latch.  A few attempts made while sandwiching breast, but baby became too sleepy to root. Baby is <6 lbs and sleepy at the breast, Mom aware of importance of consistent supplementation as a bridge to exclusive breastfeeding. ? ?Baby B placed STS in cross cradle hold with assistance.  Baby not opening his mouth to latch.  Hand expressed drops onto nipple and baby didn't lick or show interest.  Baby has significant facial and head bruising.  Mom aware of increased risk of newborn jaundice due to this and the benefit of consistent supplementation to encouraged baby's output. ? ?Set up DEBP and assisted Mom to pump for first time.  24 mm flange appeared to be correct size, but switched right side to 27 mm due to some visible nipple compression and discomfort felt.  Mom brought her own coconut oil and encouraged colostrum to nipple for pain. ? ?LC recommended consistent pumping/hand expression to provide colostrum to babies. ?Mom encouraged to call her RN if baby is rooting and latched to breast to assess feeding for need to supplement. ? ?LC recommended donor breast milk supplementation per volume guidelines.  Mom prefers to use her supply from a friend.  RN aware and will have Mom sign a release for this donor milk. ? ?Plan- ?1- STS as much as possible ?2- Every 3 hrs  or sooner, baby will be offered breast ?3-babies need supplementation of 5 ml increasing volume as tolerated if baby is not latching and feeding well.   ?4- Pump both breasts on initiation setting. ? ?Talked about the importance of good lactation f/u after discharge.  [name] has [employer].  Mom is also planning to use a doula/IBCLC [name] as she can make a home visit. ? ? ? ?Maternal Data ?Has patient been taught Hand Expression?: Yes ?Does the patient have breastfeeding experience prior to this delivery?: Yes ?How long did the patient breastfeed?: 1 year ? ?Feeding ?Mother's Current Feeding Choice: Breast Milk ? ?LATCH Score ?Latch: Too sleepy or reluctant, no latch achieved, no sucking elicited. ? ?Audible Swallowing: None ? ?Type of Nipple: Everted at rest and after stimulation ? ?Comfort (Breast/Nipple): Filling, red/small blisters or bruises, mild/mod discomfort ? ?Hold (Positioning): Assistance needed to correctly position infant at breast and maintain latch. ? ?LATCH Score: 4 ? ? ?Lactation Tools Discussed/Used ?Tools: Pump;Flanges;Coconut oil ?Flange Size: 24;27 ?Breast pump type: Double-Electric Breast Pump ?Pump Education: Setup, frequency, and cleaning;Milk Storage ?Reason for Pumping: Support milk supply/ET twins/twin A <6lbs/twin B with facial bruising ?Pumping frequency: Encouraged pumping every 2-3 hrs on initiation setting ?Pumped volume: 0 mL ? ?Interventions ?Interventions: Breast feeding basics reviewed;Assisted with latch;Skin to skin;Breast massage;Adjust position;Support pillows;Position options;DEBP;Pace feeding;LC Services brochure;Education ? ?Discharge ?Pump: Employee Pump (Spectra DEBP) ?WIC Program: No ? ?Consult Status ?Consult Status: Follow-up ?  Date: March 30, 2022 ?Follow-up type: In-patient ? ? ? ?Broadus John RN IBCLC ?August 21, 2022, 10:12 AM ? ? ? ?

## 2022-02-02 ENCOUNTER — Ambulatory Visit: Payer: Self-pay

## 2022-02-02 MED ORDER — PRENATAL MULTIVITAMIN CH: 1.0000 | ORAL_TABLET | Freq: Every day | ORAL | Status: AC

## 2022-02-02 MED ORDER — IBUPROFEN 600 MG PO TABS
600.0000 mg | ORAL_TABLET | Freq: Four times a day (QID) | ORAL | 0 refills | Status: AC
Start: 2022-02-02 — End: ?

## 2022-02-02 MED ORDER — COCONUT OIL OIL: 1.0000 "application " | TOPICAL_OIL | 0 refills | Status: AC | PRN

## 2022-02-02 MED ORDER — ACETAMINOPHEN 500 MG PO TABS: 1000.0000 mg | ORAL_TABLET | Freq: Four times a day (QID) | ORAL | 2 refills | Status: AC | PRN

## 2022-02-02 MED ORDER — BENZOCAINE-MENTHOL 20-0.5 % EX AERO: 1.0000 "application " | INHALATION_SPRAY | CUTANEOUS | Status: AC | PRN

## 2022-02-02 NOTE — Discharge Instructions (Signed)
Lactation outpatient support - home visit ° ° °Jessica Bowers, IBCLC (lactation consultant)  & Birth Doula ° °Phone (text or call): [phone] °Email: [email or web address] °[email or web address] ° ° °Linda Coppola °RN, MHA, IBCLC °at [facility]: Lactation Consultant ° °[email or web address] °Mail: [email or web address] °Tel: [phone] ° ° °Additional breastfeeding resources: ° °[facility] °https://ibconline.ca/information-sheets/ ° °[employer] ° °www.lllofnc.org ° ° °Other Resources: ° °Chiropractic specialist  ° °Dr. Leanna Hastings °[email or web address] ° ° °Craniosacral therapy for baby ° °Erin Balkind  °[email or web address] ° °

## 2022-02-02 NOTE — Lactation Note (Addendum)
This note was copied from a baby's chart. ?Lactation Consultation Note ? ?Patient Name: Latoya Reeves ?Today's Date: 02/02/2022 ?  ?Age:31 hours, multiples ( Twins) ETI's  ? ?Per mom, Baby B ( Boy) is not latching at the breast, she is mainly supplementing infant with her EBM that is pumped and donor breast milk. ?Infant is currently consuming 40 mls  per feeding today. ?Mom doesn't plan to latch baby B currently,  she is focus on him feeding well  using a bottle and mom  will work later on him latching at the breast, this is her feeding choice for her son.  ? ?Per mom, Baby A will briefly latch few minutes and she is receiving 40 mls as well per feeding. ? ?Mom is pumping every 3 hours for 15 minutes, she experiencing pain and discomfort with using DEBP, LC notice cut on mom's right breast from  pumping mom has been using a size 27 mm breast flange when pumping which appeared tight and restricted. ?Mom was at the end of her pumping session when [facility] entered the room, and pumped 20 mls of EBM that she will offer to twins at the next feeding. ?Mom will offer her EBM first and then supplement them with the donor breast milk. ?Mom was re-fitted with 30 mm breast flange and briefly used it to see if it was more comfortable, per mom, it does feel better. ?Mom was given Medela hydro gels which she placed on both breast after pumping, mom understands they are only recommended for 24 hour use and not to use them with coconut oil.  ? ?Mom's plan: ?1- She will continue to work towards latching Baby A ( girl) at the breast, mom will ask RN/LC for further latch assistance if needed. ?2- Mom knows if infant doesn't latch she will stop after 5 minutes and supplement infant with her EBM/ donor milk. ?3- Mom will continue to use DEBP every 3 hours for 15 minutes on initial setting , giving twins her EBM first before donor. ?4-Per mom, she has LC that will assist her with latching infant at the breast at home after discharge , LC  will do home visits and it's covered with her husband's insurance. ? ?Maternal Data ?  ? ?Feeding ?Nipple Type: Slow - flow ? ?LATCH Score ?  ? ?  ? ?  ? ?  ? ?  ? ?  ? ? ?Lactation Tools Discussed/Used ?  ? ?Interventions ?  ? ?Discharge ?  ? ?Consult Status ?  ? ? ? ?Danelle Earthly ?02/02/2022, 8:25 PM ? ? ? ?

## 2022-02-02 NOTE — Discharge Summary (Signed)
?OB Discharge Summary ? ?Patient Name: SHAHERAH FERRIS ?DOB: Jan 27, 1991 ?MRN: [id number] ? ?Date of admission: 01/31/2022 ?Delivering provider: Dr. Benjie Karvonen ?  ?Rustie, Gill [place] [id number]  ?MODY, VAISHALI  ?  ?Bobie, Milten[place] [id number]  ?MODY, VAISHALI  ? ?Admitting diagnosis: Pregnancy [Z34.90] ?Pregnancy, twins, delivered [O30.009] ?Intrauterine pregnancy: [redacted]w[redacted]d     ?Secondary diagnosis: ?Patient Active Problem List  ? Diagnosis Date Noted  ? Dichorionic diamniotic twin gestation 01/31/2022  ? Postpartum care following vaginal twins delivery 5/9 11/21/2018  ? Obstetrical laceration: median episiotomy  11/21/2018  ? 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 ?Additional problems: Kathi Der twins with twin A poor fetal growth  ? ?Date of discharge: 02/02/2022   ?Discharge diagnosis: Principal Problem: ?  Postpartum care following vaginal twins delivery 5/9 ?Active Problems: ?  Obstetrical laceration: median episiotomy  ?  Dichorionic diamniotic twin gestation ? ?                                                            ?Post partum procedures: none ? ?Augmentation: AROM and Pitocin ?Pain control:  ?  ?Berlyn, Wesoloski [place] [id number]  ?Local  ?  ?Tyonia, Darty [place] [id number]  ?Local  ?Laceration: ?  ?Judieth, Junior [place] [id number]  ?None  ?  ?Sumya, Mohabir [place] [id number]  ?None  ?Episiotomy: ?  ?Elleah, Delling [place] [id number]  ?Median  ?  ?Marilynne, Perezhernandez [place] [id number]  ?Median  ?Complications: None ? ?Hospital course:  Induction of Labor With Vaginal Delivery   ?31 y.o. yo [id number] at [redacted]w[redacted]d was admitted to the hospital 01/31/2022 for induction of labor.  Indication for induction:  DiDi twins, IUGR twin A .  Patient had an uncomplicated labor course as follows: ?Membrane Rupture Time/Date:  ?  ?Jorian, Moore [place] [id number]  ?1:52 PM  ?  ?Dondrea, Caviness [place] [id number]  ?7:42 PM , ?  ?Matea, Ridl [place] [id number]  ?01/31/2022  ?  ?Talaiyah, Weekes [place] [id number]  ?01/31/2022   ?Delivery Method: ?  ?Amierah, [place] [id number]  ?Vaginal, Spontaneous  ?  ?Znya, Elk [place] [id number]  ?Vaginal, Spontaneous  ?Episiotomy:  ?  ?Anneleise, Stelma [place] [id number]  ?Median  ?  ?Jeffrey, Geesaman [place] [id number]  ?Median  ?Lacerations:   ?  ?Timira, Leidel [place] [id number]  ?None  ?  ?Linnae, Angiolillo [place] [id number]  ?None  ?Details of delivery can be found in separate delivery note.  Patient had a routine postpartum course. Patient is discharged home 02/02/22. ? ?Newborn Data: ?Birth date: ?  ?Breeonna, Larmon [place] [id number]  ?01/31/2022  ?  ?Pamie, [place]ge [place] [id number]  ?01/31/2022  ?Birth time: ?  ?Laporshe, Catoe [place] [id number]  ?7:32 PM  ?  ?Rasheka, Loveless [place] [id number]  ?7:59 PM  ?Gender: ?  ?Kdynce, Koroma [place] [id number]  ?Female  ?  ?Lorina, Dendy [place] [id number]  ?Female  ?Living status: ?  ?Mkenzie, Tacker [place] [id number]  ?Living  ?  ?Syana, [place] [id number]  ?Living  ?Apgars: ?  ?Shoko, Mcgowan [place] [id number]  ?8  ?  ?Happy, Balian [place] [id number]  ?8 , ?  ?Nzuri, Stengle [place] [id number]  ?9  ?  ?Haasini, Yao [place] [id number]  ?9  ?Weight: ?  ?Vivika, Witter [place] [id number]  ?2390 g  ?  ?Malyka, Winnie [place] [id number]  ?3030 g  ? ?  Physical exam  ?Vitals:  ? 02/01/22 0738 02/01/22 1643 02/01/22 2208 02/02/22 0532  ?BP: (!) 115/54 116/72 113/66 (!) 108/56  ?Pulse: (!) 50 (!) 51 (!) 59 (!) 58  ?Resp: 18 18 19 18   ?Temp: 98.1 ?F (36.7 ?C) 97.6 ?F (36.4 ?C) 98 ?F (36.7 ?C) 98.4 ?F (36.9 ?C)  ?TempSrc: Oral Oral Oral Oral  ?SpO2: 97% 97% 98%   ?Weight:      ?Height:      ? ?General: alert, cooperative, and no distress ?Lochia: appropriate ?Uterine Fundus: firm ?Incision: N/A ?Perineum: repair intact, no edema ?DVT Evaluation: No cords or calf tenderness. ?No significant calf/ankle edema. ?Labs: ?Lab Results  ?Component Value Date  ? WBC 12.1 (H) 02/01/2022  ? HGB 12.7 02/01/2022  ? HCT 35.9 (L) 02/01/2022  ? MCV 92.8 02/01/2022  ? PLT 157 02/01/2022  ? ? ?  Latest Ref Rng & Units 10/03/2017  ?  2:36 PM   ?CMP  ?Glucose 65 - 99 mg/dL 93    ?BUN 6 - 20 mg/dL 13    ?Creatinine 0.44 - 1.00 mg/dL 0.56    ?Sodium 135 - 145 mmol/L 134    ?Potassium 3.5 - 5.1 mmol/L 3.5    ?Chloride 101 - 111 mmol/L 104    ?CO2 22 - 32 mmol/L 24    ?Calcium 8.9 - 10.3 mg/dL 9.2    ? ? ?  11/22/2018  ?  9:20 PM  ?Edinburgh Postnatal Depression Scale Screening Tool  ?I have been able to laugh and see the funny side of things. 0  ?I have looked forward with enjoyment to things. 0  ?I have blamed myself unnecessarily when things went wrong. 0  ?I have been anxious or worried for no good reason. 0  ?I have felt scared or panicky for no good reason. 0  ?Things have been getting on top of me. 0  ?I have been so unhappy that I have had difficulty sleeping. 0  ?I have felt sad or miserable. 0  ?I have been so unhappy that I have been crying. 0  ?The thought of harming myself has occurred to me. 0  ?Edinburgh Postnatal Depression Scale Total 0  ? ?Vaccines: TDaP          UTD ?       COVID-19   declined ?       Flu             UTD ? ?Discharge instruction:  ?per After Visit Summary,  ?[facility] booklet and  ?"[facility]" hospital booklet ? ?After Visit Meds:  ?Allergies as of 02/02/2022   ? ?   Reactions  ? Alpha-gal Anaphylaxis, Hives  ? Other Anaphylaxis  ? ALPHA-GAL (BEEF, PORK, LAMB, DEER, MUTTON).  REACTION ANAPHYLAXIS, HIVES, GI ISSUES.  ? ?  ? ?  ?Medication List  ?  ? ?TAKE these medications   ? ?acetaminophen 500 MG tablet ?Commonly known as: TYLENOL ?Take 2 tablets (1,000 mg total) by mouth every 6 (six) hours as needed. ?  ?azelastine 0.1 % nasal spray ?Commonly known as: ASTELIN ?Place 1 spray into both nostrils 2 (two) times daily. Use in each nostril as directed ?  ?benzocaine-Menthol 20-0.5 % Aero ?Commonly known as: DERMOPLAST ?Apply 1 application. topically as needed for irritation (perineal discomfort). ?  ?coconut oil Oil ?Apply 1 application. topically as needed. ?  ?ibuprofen 600 MG tablet ?Commonly known  as: ADVIL ?Take 1 tablet (600 mg total) by mouth every  6 (six) hours. ?  ?prenatal multivitamin Tabs tablet ?Take 1 tablet by mouth daily at 12 noon. ?  ? ?  ? ?  ?  ? ? ?  ?Discharge Care Instructions  ?(From admission, onward)  ?  ? ? ?  ? ?  Start     Ordered  ? 02/02/22 0000  Discharge wound care:       ?Comments: Sitz baths 2 times /day with warm water x 1 week. ?May add herbals: ?1 ounce dried comfrey leaf* ?1 ounce calendula flowers ?1 ounce lavender flowers ? ?Supplies can be found online at [employer] ?Local sources at [employer], [employer] ? ?1/2 ounce dried uva ursi leaves ?1/2 ounce witch hazel blossoms (if you can find them) ?1/2 ounce dried sage leaf ?1/2 cup sea salt ?Directions: Bring 2 quarts of water to a boil. Turn off heat, and place 1 ounce (approximately 1 large handful) of the above mixed herbs (not the salt) into the pot. Steep, covered, for 30 minutes. ? ?Strain the liquid well with a fine mesh strainer, and discard the herb material. Add 2 quarts of liquid to the tub, along with the 1/2 cup of salt. ?This medicinal liquid can also be made into compresses and peri-rinses.  ? 02/02/22 0958  ? ?  ?  ? ?  ? ? ?Diet: routine diet ? ?Activity: Advance as tolerated. Pelvic rest for 6 weeks.  ? ?Postpartum contraception: TBA in office ? ?Newborn Data: ?  ?Sailer, Tulk [place] [id number]  ?Live born female  ?Birth Weight: 5 lb 4.3 oz (2390 g) ?APGAR: 8, 9 ? ?Newborn Delivery   ?Birth date/time: 01/31/2022 19:32:00 ?Delivery type: Vaginal, Spontaneous ?  ?  ?  ?Justyce, Baez [place] [id number]  ?Live born female  ?Birth Weight: 6 lb 10.9 oz (3030 g) ?APGAR: 8, 9 ? ?Newborn Delivery   ?Birth date/time: 01/[birth year]/2022 19:59:00 ?Delivery type: Vaginal, Spontaneous ?  ?  ? named [place] and Amedeo Plenty ?Baby Feeding: Bottle and Breast ?Disposition:rooming in, twin A with feeding difficulties, will remain inpatient additional day.  ?Circumcision: planned when cleared by peds ? ? ?Delivery Report: ? ?Review the  Delivery Report for details.   ? ?Follow up: ? Follow-up Information   ? ? Azucena Fallen, MD. Schedule an appointment as soon as possible for a visit in 6 week(s).   ?Specialty: Obstetrics and Gynecology ?Why: For

## 2022-02-03 ENCOUNTER — Ambulatory Visit: Payer: Self-pay

## 2022-02-03 DIAGNOSIS — Z412 Encounter for routine and ritual male circumcision: Secondary | ICD-10-CM | POA: Diagnosis not present

## 2022-02-03 NOTE — Lactation Note (Signed)
This note was copied from a baby's chart. ?Lactation Consultation Note ? ?Patient Name: Latoya Reeves ?Today's Date: 02/03/2022 ?Reason for consult: Follow-up assessment;Early term 37-38.6wks ?Age:31 hours ? ?LC in to visit with P3 Mom of ET twins on day of discharge.  Mom concerned she is engorged and nurse provided her with ice packs. ?Breasts appear filling, volume expressed was 120 ml.  Mom reassured that she was not engorged, but that this was normal onset of lactogenesis II and it was a great thing for her and her babies. ? ?Encouraged frequent pumping every 2-3 hrs during the day, allowing baby or babies to latch to breast prn.  Mom's job right now is to establish her milk supply with frequent pumping, hand expression and STS.  2 belly bands provided for double pumping and STS.  Mom appreciative. ? ?Baby A is at 4% weight loss and will latch to the breast for brief periods.  Mom is supplementing with slow flow bottle ?Baby B is at 8% weight loss and is too sleepy to latch at present. ? ?Mom will continue to pump and supplement until weight gain is consistent and she has F/U with OP lactation.  Mom will be having a home visit with an [place]. ? ?Mom aware of OP lactation support available, and knows to call prn. ? ? Lactation Tools Discussed/Used ?Tools: Pump;Flanges ?Flange Size: 30;27 ?Breast pump type: Double-Electric Breast Pump ?Pump Education: Setup, frequency, and cleaning;Milk Storage ?Reason for Pumping: Twins/ET/sleepy ?Pumping frequency: Encouraged to pump every 2-3 hrs during the day and 3-4 hrs at night ?Pumped volume: 120 mL ? ?Interventions ?Interventions: Breast feeding basics reviewed;Skin to skin;Breast massage;Hand express;Coconut oil;DEBP;Ice;Education ? ?Discharge ?Discharge Education: Engorgement and breast care;Warning signs for feeding baby;Outpatient recommendation ?Pump: Personal (Mom has 3 DEBPs at home) ? ?Consult Status ?Consult Status: Complete (mother declined follow up) ?Date:  02/03/22 ?Follow-up type: Call as needed ? ? ? ?Tilda Burrow E ?02/03/2022, 10:38 AM ? ? ? ?

## 2022-02-08 ENCOUNTER — Telehealth (HOSPITAL_COMMUNITY): Payer: Self-pay | Admitting: *Deleted

## 2022-02-08 NOTE — Telephone Encounter (Signed)
Attempted hospital discharge follow-up call. Left message for patient to return RN call. Erline Levine, RN, 02/08/22, [phone] ?

## 2022-04-26 IMAGING — US US RENAL
1 series · 14 of 25 positions shown · non-contrast
Comparison: CT 12/20/2010

CLINICAL DATA: Intermittent left flank pain

EXAM:
RENAL / URINARY TRACT ULTRASOUND COMPLETE

[Series 1: us renal · 14 of 43 slices shown]
[im 1/43]
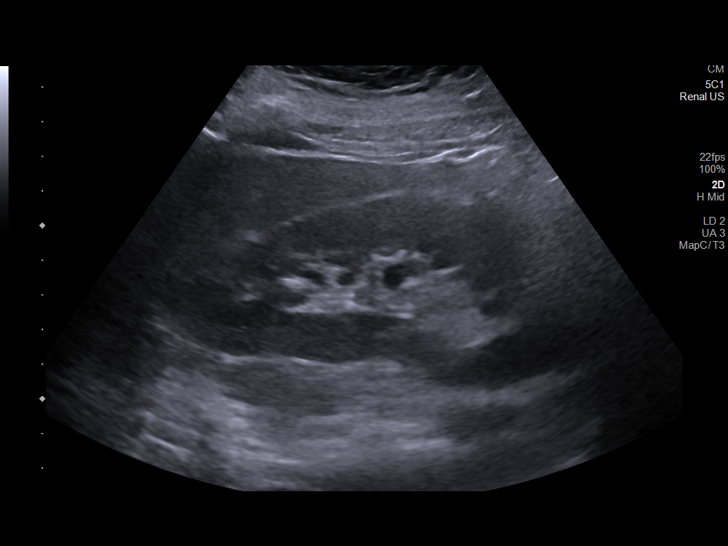
[im 4/43]
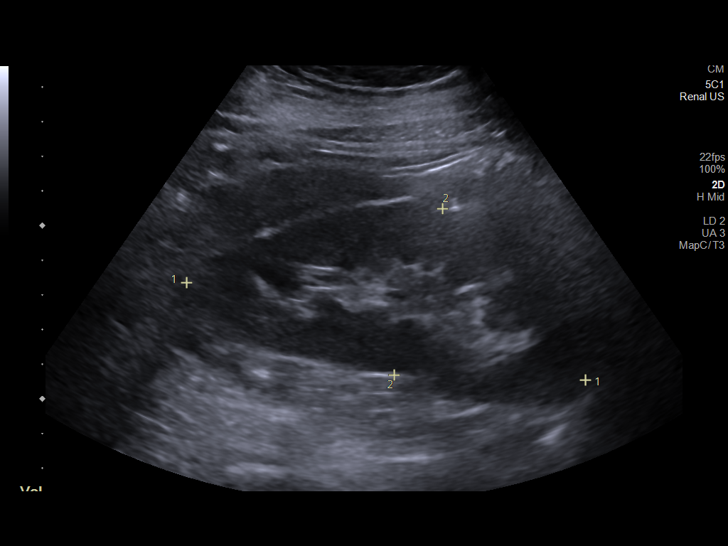
[im 8/43]
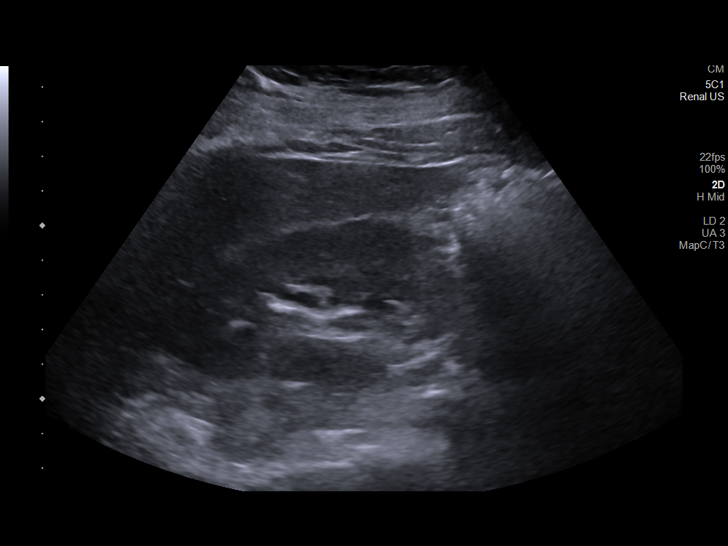
[im 11/43]
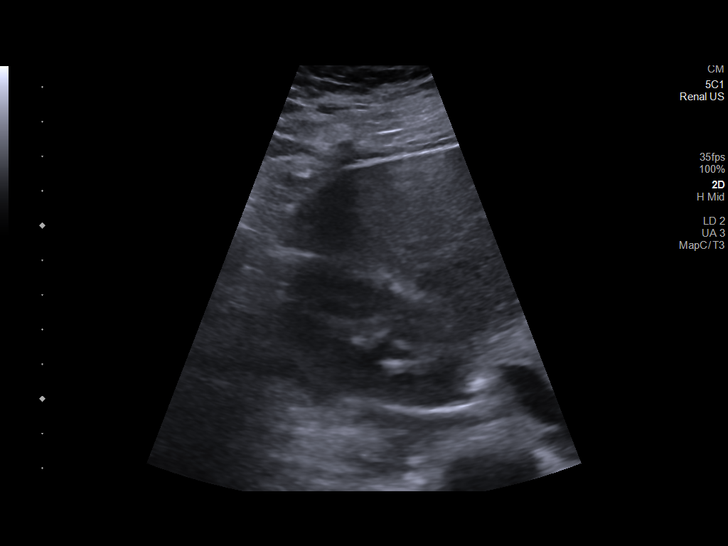
[im 15/43]
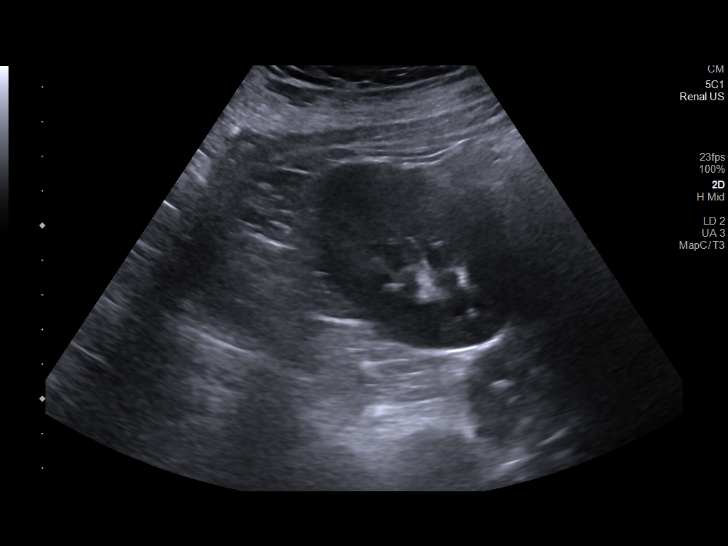
[im 16/43]
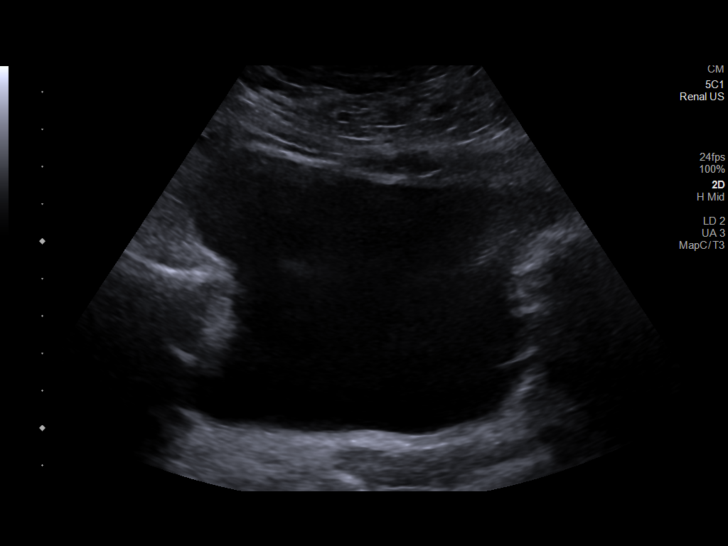
[im 20/43]
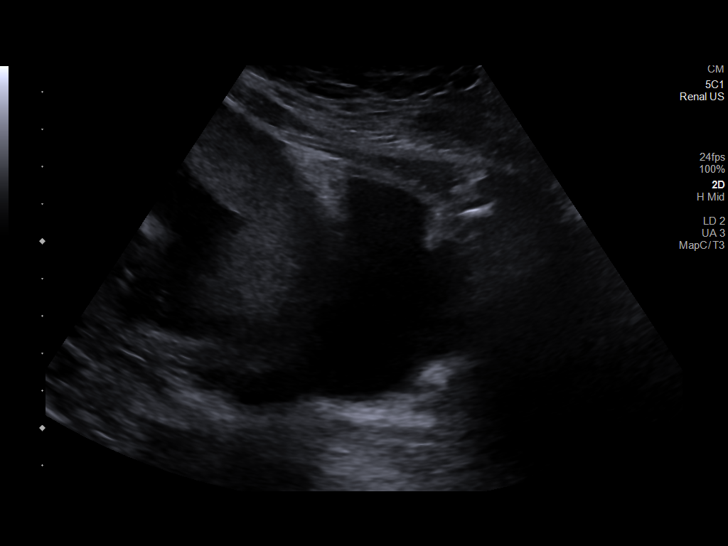
[im 23/43]
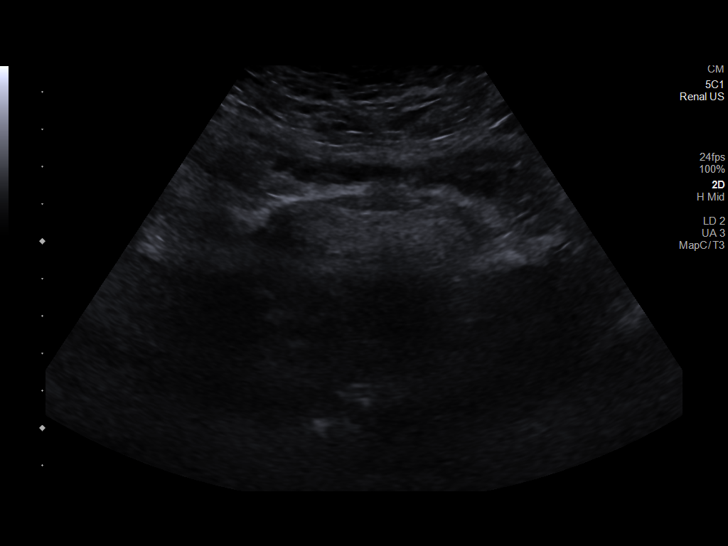
[im 27/43]
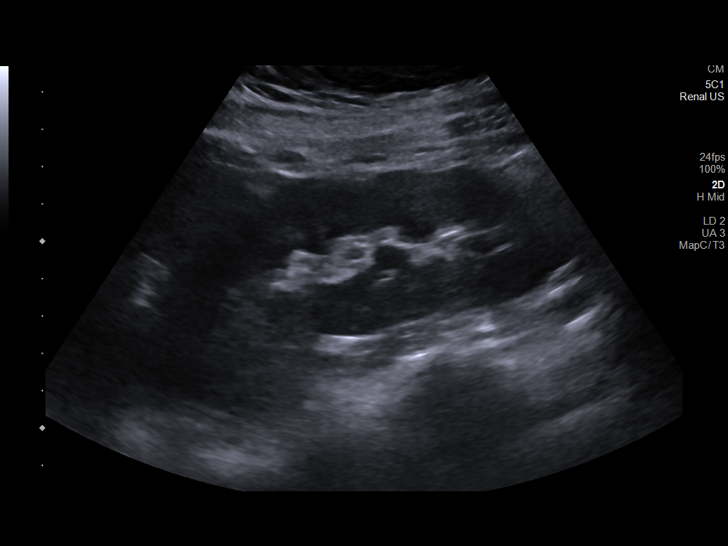
[im 29/43]
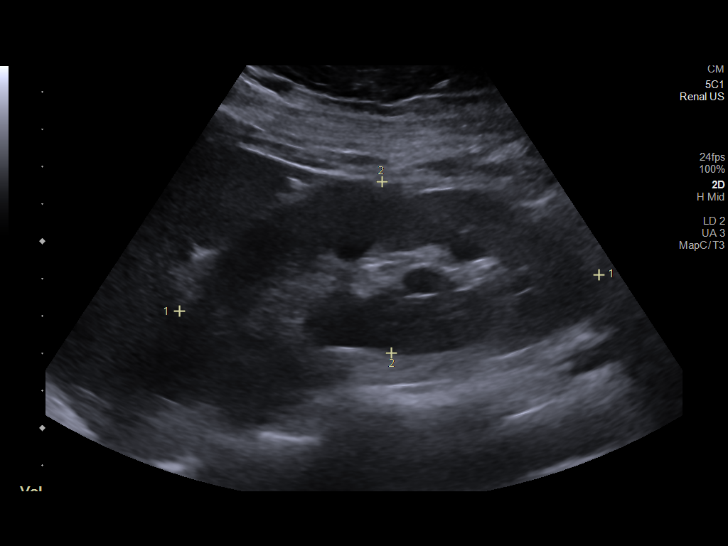
[im 32/43]
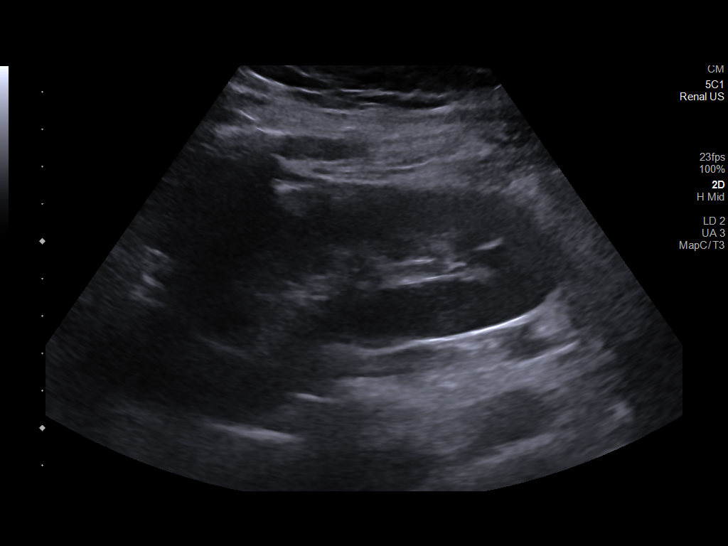
[im 36/43]
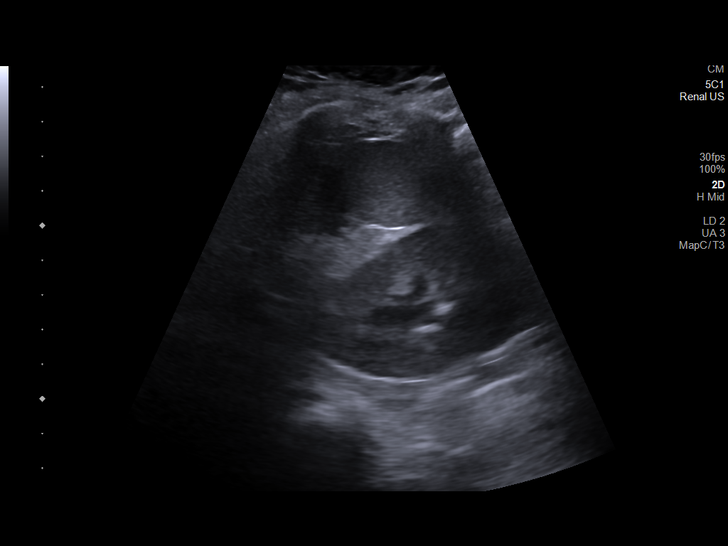
[im 39/43]
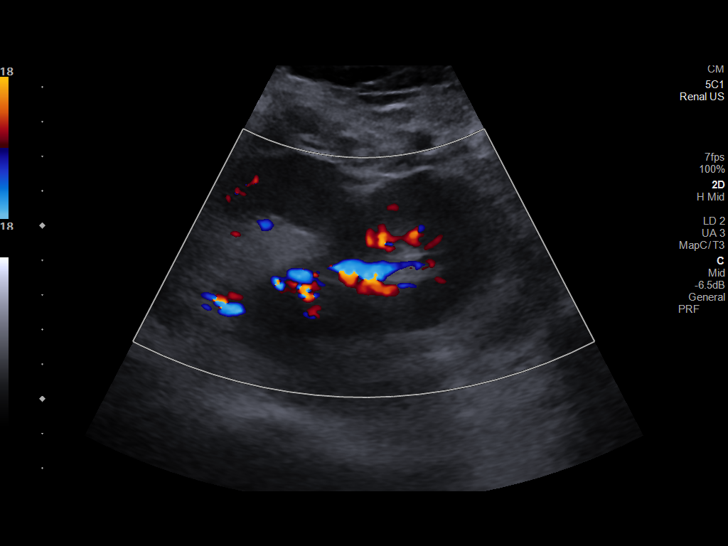
[im 43/43]
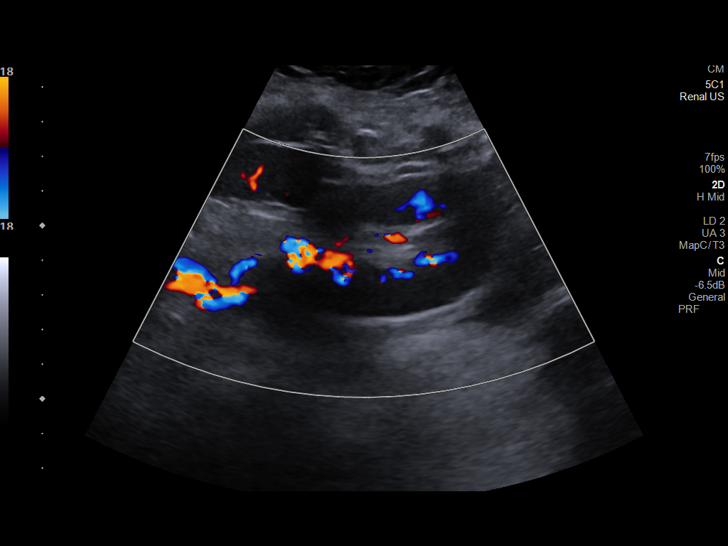

[14 of 25 positions shown; findings below may reference images not displayed]

FINDINGS: Right Kidney:

Renal measurements: 11.9 x 5.0 x 5.9 cm = volume: 182 mL.
Echogenicity within normal limits. No mass, shadowing stone, or
hydronephrosis visualized.

Left Kidney:

Renal measurements: 11.3 x 4.6 x 5.4 cm = volume: 146 mL.
Echogenicity within normal limits. No mass, shadowing stone, or
hydronephrosis visualized.

Bladder:

Appears normal for degree of bladder distention.

Other:

None.
IMPRESSION: Normal ultrasound of the kidneys and bladder.

## 2023-07-31 ENCOUNTER — Other Ambulatory Visit (HOSPITAL_COMMUNITY): Payer: Self-pay | Admitting: Family Medicine

## 2023-07-31 ENCOUNTER — Ambulatory Visit (HOSPITAL_COMMUNITY)
Admission: RE | Admit: 2023-07-31 | Discharge: 2023-07-31 | Disposition: A | Discharge status: Home / Self Care | Payer: 59 | Source: Ambulatory Visit | Attending: Family Medicine | Admitting: Family Medicine

## 2023-07-31 DIAGNOSIS — M79672 Pain in left foot: Secondary | ICD-10-CM | POA: Diagnosis present

## 2024-01-02 ENCOUNTER — Other Ambulatory Visit (HOSPITAL_COMMUNITY): Payer: Self-pay | Admitting: Family Medicine

## 2024-01-02 DIAGNOSIS — R109 Unspecified abdominal pain: Secondary | ICD-10-CM

## 2024-01-15 ENCOUNTER — Ambulatory Visit (HOSPITAL_COMMUNITY)
Admission: RE | Admit: 2024-01-15 | Discharge: 2024-01-15 | Disposition: A | Discharge status: Home / Self Care | Source: Ambulatory Visit | Attending: Family Medicine | Admitting: Family Medicine

## 2024-01-15 DIAGNOSIS — R109 Unspecified abdominal pain: Secondary | ICD-10-CM | POA: Diagnosis present

## 2024-02-25 ENCOUNTER — Other Ambulatory Visit (HOSPITAL_COMMUNITY): Payer: Self-pay | Admitting: Family Medicine

## 2024-02-25 DIAGNOSIS — R109 Unspecified abdominal pain: Secondary | ICD-10-CM

## 2024-08-26 DIAGNOSIS — R5382 Chronic fatigue, unspecified: Secondary | ICD-10-CM | POA: Diagnosis not present

## 2024-08-26 DIAGNOSIS — L658 Other specified nonscarring hair loss: Secondary | ICD-10-CM | POA: Diagnosis not present

## 2024-08-26 DIAGNOSIS — R891 Abnormal level of hormones in specimens from other organs, systems and tissues: Secondary | ICD-10-CM | POA: Diagnosis not present
